# Patient Record
Sex: Female | Born: 1975 | Race: White | Hispanic: No | Marital: Single | Smoking: Current every day smoker
Health system: Southern US, Community
[De-identification: ages and names within clinical notes are randomized; demographics above are authoritative.]

## PROBLEM LIST (undated history)

## (undated) DIAGNOSIS — F319 Bipolar disorder, unspecified: Secondary | ICD-10-CM

## (undated) DIAGNOSIS — F32A Depression, unspecified: Secondary | ICD-10-CM

## (undated) DIAGNOSIS — F1910 Other psychoactive substance abuse, uncomplicated: Secondary | ICD-10-CM

## (undated) DIAGNOSIS — F329 Major depressive disorder, single episode, unspecified: Secondary | ICD-10-CM

## (undated) DIAGNOSIS — F419 Anxiety disorder, unspecified: Secondary | ICD-10-CM

## (undated) HISTORY — PX: NO PAST SURGERIES: SHX2092

---

## 2007-04-20 ENCOUNTER — Emergency Department (HOSPITAL_COMMUNITY): Admission: EM | Admit: 2007-04-20 | Discharge: 2007-04-20 | Payer: Self-pay | Admitting: Family Medicine

## 2007-08-03 ENCOUNTER — Emergency Department (HOSPITAL_COMMUNITY): Admission: EM | Admit: 2007-08-03 | Discharge: 2007-08-03 | Payer: Self-pay | Admitting: Emergency Medicine

## 2007-12-04 ENCOUNTER — Emergency Department (HOSPITAL_COMMUNITY): Admission: EM | Admit: 2007-12-04 | Discharge: 2007-12-05 | Payer: Self-pay | Admitting: Emergency Medicine

## 2007-12-08 ENCOUNTER — Emergency Department (HOSPITAL_COMMUNITY): Admission: EM | Admit: 2007-12-08 | Discharge: 2007-12-08 | Payer: Self-pay | Admitting: Emergency Medicine

## 2008-08-02 ENCOUNTER — Emergency Department (HOSPITAL_COMMUNITY): Admission: EM | Admit: 2008-08-02 | Discharge: 2008-08-02 | Payer: Self-pay | Admitting: Emergency Medicine

## 2009-11-18 ENCOUNTER — Emergency Department (HOSPITAL_COMMUNITY): Admission: EM | Admit: 2009-11-18 | Discharge: 2009-11-19 | Payer: Self-pay | Admitting: Emergency Medicine

## 2009-12-05 ENCOUNTER — Emergency Department (HOSPITAL_COMMUNITY): Admission: EM | Admit: 2009-12-05 | Discharge: 2009-12-07 | Payer: Self-pay | Admitting: Emergency Medicine

## 2009-12-06 ENCOUNTER — Ambulatory Visit: Payer: Self-pay | Admitting: Psychiatry

## 2009-12-15 ENCOUNTER — Emergency Department (HOSPITAL_COMMUNITY): Admission: EM | Admit: 2009-12-15 | Discharge: 2009-12-16 | Payer: Self-pay | Admitting: Emergency Medicine

## 2009-12-18 ENCOUNTER — Emergency Department (HOSPITAL_COMMUNITY): Admission: EM | Admit: 2009-12-18 | Discharge: 2009-12-18 | Payer: Self-pay | Admitting: Emergency Medicine

## 2010-09-24 LAB — CBC
Hemoglobin: 13.6 g/dL (ref 12.0–15.0)
MCHC: 33.9 g/dL (ref 30.0–36.0)
MCV: 102.6 fL — ABNORMAL HIGH (ref 78.0–100.0)
Platelets: 342 10*3/uL (ref 150–400)
RDW: 12.7 % (ref 11.5–15.5)

## 2010-09-24 LAB — BASIC METABOLIC PANEL
Calcium: 8.8 mg/dL (ref 8.4–10.5)
GFR calc Af Amer: >60 mL/min (ref >60)
GFR calc non Af Amer: >60 mL/min (ref >60)

## 2010-09-24 LAB — RAPID URINE DRUG SCREEN, HOSP PERFORMED
Amphetamines: NOT DETECTED
Opiates: NOT DETECTED

## 2016-12-19 DIAGNOSIS — T50902A Poisoning by unspecified drugs, medicaments and biological substances, intentional self-harm, initial encounter: Secondary | ICD-10-CM

## 2016-12-19 DIAGNOSIS — T426X2A Poisoning by other antiepileptic and sedative-hypnotic drugs, intentional self-harm, initial encounter: Secondary | ICD-10-CM

## 2016-12-19 DIAGNOSIS — F10121 Alcohol abuse with intoxication delirium: Secondary | ICD-10-CM

## 2016-12-19 DIAGNOSIS — J9601 Acute respiratory failure with hypoxia: Secondary | ICD-10-CM

## 2016-12-19 DIAGNOSIS — T426X1A Poisoning by other antiepileptic and sedative-hypnotic drugs, accidental (unintentional), initial encounter: Secondary | ICD-10-CM

## 2016-12-23 ENCOUNTER — Encounter (HOSPITAL_COMMUNITY): Payer: Self-pay | Admitting: *Deleted

## 2016-12-23 ENCOUNTER — Inpatient Hospital Stay (HOSPITAL_COMMUNITY)
Admission: AD | Admit: 2016-12-23 | Discharge: 2016-12-26 | DRG: 918 | Disposition: A | Discharge status: Home / Self Care | Payer: No Typology Code available for payment source | Attending: Psychiatry | Admitting: Psychiatry

## 2016-12-23 DIAGNOSIS — Z79899 Other long term (current) drug therapy: Secondary | ICD-10-CM

## 2016-12-23 DIAGNOSIS — T428X1A Poisoning by antiparkinsonism drugs and other central muscle-tone depressants, accidental (unintentional), initial encounter: Secondary | ICD-10-CM | POA: Diagnosis present

## 2016-12-23 DIAGNOSIS — T50902A Poisoning by unspecified drugs, medicaments and biological substances, intentional self-harm, initial encounter: Secondary | ICD-10-CM | POA: Diagnosis present

## 2016-12-23 DIAGNOSIS — R630 Anorexia: Secondary | ICD-10-CM | POA: Diagnosis present

## 2016-12-23 DIAGNOSIS — F17210 Nicotine dependence, cigarettes, uncomplicated: Secondary | ICD-10-CM | POA: Diagnosis not present

## 2016-12-23 DIAGNOSIS — F332 Major depressive disorder, recurrent severe without psychotic features: Secondary | ICD-10-CM | POA: Diagnosis present

## 2016-12-23 DIAGNOSIS — F3162 Bipolar disorder, current episode mixed, moderate: Secondary | ICD-10-CM | POA: Diagnosis present

## 2016-12-23 DIAGNOSIS — F1410 Cocaine abuse, uncomplicated: Secondary | ICD-10-CM | POA: Diagnosis present

## 2016-12-23 DIAGNOSIS — F410 Panic disorder [episodic paroxysmal anxiety] without agoraphobia: Secondary | ICD-10-CM | POA: Diagnosis present

## 2016-12-23 DIAGNOSIS — T426X2A Poisoning by other antiepileptic and sedative-hypnotic drugs, intentional self-harm, initial encounter: Secondary | ICD-10-CM | POA: Diagnosis not present

## 2016-12-23 DIAGNOSIS — F1020 Alcohol dependence, uncomplicated: Secondary | ICD-10-CM | POA: Diagnosis present

## 2016-12-23 DIAGNOSIS — T1491XA Suicide attempt, initial encounter: Secondary | ICD-10-CM | POA: Diagnosis not present

## 2016-12-23 DIAGNOSIS — F1010 Alcohol abuse, uncomplicated: Secondary | ICD-10-CM | POA: Diagnosis present

## 2016-12-23 DIAGNOSIS — F1910 Other psychoactive substance abuse, uncomplicated: Secondary | ICD-10-CM

## 2016-12-23 DIAGNOSIS — F419 Anxiety disorder, unspecified: Secondary | ICD-10-CM | POA: Diagnosis not present

## 2016-12-23 DIAGNOSIS — G4700 Insomnia, unspecified: Secondary | ICD-10-CM | POA: Diagnosis present

## 2016-12-23 DIAGNOSIS — Z818 Family history of other mental and behavioral disorders: Secondary | ICD-10-CM | POA: Diagnosis not present

## 2016-12-23 HISTORY — DX: Bipolar disorder, unspecified: F31.9

## 2016-12-23 HISTORY — DX: Other psychoactive substance abuse, uncomplicated: F19.10

## 2016-12-23 HISTORY — DX: Anxiety disorder, unspecified: F41.9

## 2016-12-23 HISTORY — DX: Major depressive disorder, single episode, unspecified: F32.9

## 2016-12-23 HISTORY — DX: Depression, unspecified: F32.A

## 2016-12-23 MED ORDER — HYDROXYZINE HCL 25 MG PO TABS: 25.0000 mg | ORAL_TABLET | Freq: Three times a day (TID) | ORAL | Status: DC | PRN

## 2016-12-23 MED ORDER — HYDROXYZINE HCL 25 MG PO TABS
ORAL_TABLET | ORAL | Status: AC
  Filled 2016-12-23: qty 1

## 2016-12-23 MED ORDER — ADULT MULTIVITAMIN W/MINERALS CH
1.0000 | ORAL_TABLET | Freq: Every day | ORAL | Status: DC
Start: 2016-12-24 — End: 2016-12-26
  Administered 2016-12-24 – 2016-12-26 (×3): 1 via ORAL
  Filled 2016-12-23 (×4): qty 1

## 2016-12-23 MED ORDER — TRAZODONE HCL 50 MG PO TABS
50.0000 mg | ORAL_TABLET | Freq: Every evening | ORAL | Status: DC | PRN
  Administered 2016-12-23 – 2016-12-25 (×3): 50 mg via ORAL
  Filled 2016-12-23 (×2): qty 1
  Filled 2016-12-23: qty 7

## 2016-12-23 MED ORDER — LORAZEPAM 1 MG PO TABS: 1.0000 mg | ORAL_TABLET | Freq: Three times a day (TID) | ORAL | Status: DC

## 2016-12-23 MED ORDER — LORAZEPAM 1 MG PO TABS: 1.0000 mg | ORAL_TABLET | Freq: Every day | ORAL | Status: DC

## 2016-12-23 MED ORDER — PNEUMOCOCCAL VAC POLYVALENT 25 MCG/0.5ML IJ INJ
0.5000 mL | INJECTION | INTRAMUSCULAR | Status: AC
  Administered 2016-12-24: 0.5 mL via INTRAMUSCULAR

## 2016-12-23 MED ORDER — VITAMIN B-1 100 MG PO TABS
100.0000 mg | ORAL_TABLET | Freq: Every day | ORAL | Status: DC
  Administered 2016-12-24 – 2016-12-26 (×3): 100 mg via ORAL
  Filled 2016-12-23 (×4): qty 1

## 2016-12-23 MED ORDER — HYDROXYZINE HCL 25 MG PO TABS
25.0000 mg | ORAL_TABLET | Freq: Four times a day (QID) | ORAL | Status: DC | PRN
  Administered 2016-12-23 – 2016-12-24 (×2): 25 mg via ORAL
  Filled 2016-12-23: qty 1

## 2016-12-23 MED ORDER — ACETAMINOPHEN 325 MG PO TABS
650.0000 mg | ORAL_TABLET | Freq: Four times a day (QID) | ORAL | Status: DC | PRN
  Administered 2016-12-24 – 2016-12-25 (×2): 650 mg via ORAL
  Filled 2016-12-23: qty 2

## 2016-12-23 MED ORDER — ONDANSETRON 4 MG PO TBDP: 4.0000 mg | ORAL_TABLET | Freq: Four times a day (QID) | ORAL | Status: DC | PRN

## 2016-12-23 MED ORDER — LORAZEPAM 1 MG PO TABS: 1.0000 mg | ORAL_TABLET | Freq: Four times a day (QID) | ORAL | Status: DC | PRN

## 2016-12-23 MED ORDER — LOPERAMIDE HCL 2 MG PO CAPS: 2.0000 mg | ORAL_CAPSULE | ORAL | Status: DC | PRN

## 2016-12-23 MED ORDER — THIAMINE HCL 100 MG/ML IJ SOLN: 100.0000 mg | Freq: Once | INTRAMUSCULAR | Status: DC

## 2016-12-23 MED ORDER — ALUM & MAG HYDROXIDE-SIMETH 200-200-20 MG/5ML PO SUSP: 30.0000 mL | ORAL | Status: DC | PRN

## 2016-12-23 MED ORDER — LORAZEPAM 1 MG PO TABS
1.0000 mg | ORAL_TABLET | Freq: Four times a day (QID) | ORAL | Status: DC
  Administered 2016-12-23 – 2016-12-24 (×3): 1 mg via ORAL
  Filled 2016-12-23 (×2): qty 1

## 2016-12-23 MED ORDER — LORAZEPAM 1 MG PO TABS
ORAL_TABLET | ORAL | Status: AC
Start: 2016-12-23 — End: 2016-12-24
  Filled 2016-12-23: qty 1

## 2016-12-23 MED ORDER — TRAZODONE HCL 50 MG PO TABS
ORAL_TABLET | ORAL | Status: AC
  Filled 2016-12-23: qty 1

## 2016-12-23 MED ORDER — LORAZEPAM 1 MG PO TABS: 1.0000 mg | ORAL_TABLET | Freq: Two times a day (BID) | ORAL | Status: DC

## 2016-12-23 MED ORDER — MAGNESIUM HYDROXIDE 400 MG/5ML PO SUSP: 30.0000 mL | Freq: Every day | ORAL | Status: DC | PRN

## 2016-12-23 MED ORDER — NICOTINE 21 MG/24HR TD PT24
21.0000 mg | MEDICATED_PATCH | Freq: Every day | TRANSDERMAL | Status: DC
  Administered 2016-12-24 – 2016-12-26 (×3): 21 mg via TRANSDERMAL
  Filled 2016-12-23 (×4): qty 1
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
## 2016-12-23 NOTE — Tx Team (Signed)
Initial Treatment Plan 12/23/2016 8:56 PM Amada JupiterJennifer V Shadowens ZOX:[id number]    PATIENT STRESSORS: Financial difficulties Legal issue Marital or family conflict Substance abuse   PATIENT STRENGTHS: Ability for insight Capable of independent living Motivation for treatment/growth Supportive family/friends   PATIENT IDENTIFIED PROBLEMS: Polysubstance abuse  depression  "i need detox"  "lost my job"  "seperated from wife"             DISCHARGE CRITERIA:  Improved stabilization in mood, thinking, and/or behavior Verbal commitment to aftercare and medication compliance  PRELIMINARY DISCHARGE PLAN: Participate in family therapy Return to previous living arrangement  PATIENT/FAMILY INVOLVEMENT: This treatment plan has been presented to and reviewed with the patient, Amada JupiterJennifer V Tedeschi.  The patient and family have been given the opportunity to ask questions and make suggestions.  Murlean IbaLawson, Heidi Maclin T, RN 12/23/2016, 8:56 PM
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
## 2016-12-24 DIAGNOSIS — F17210 Nicotine dependence, cigarettes, uncomplicated: Secondary | ICD-10-CM

## 2016-12-24 DIAGNOSIS — F1410 Cocaine abuse, uncomplicated: Secondary | ICD-10-CM | POA: Diagnosis present

## 2016-12-24 DIAGNOSIS — F1010 Alcohol abuse, uncomplicated: Secondary | ICD-10-CM | POA: Diagnosis present

## 2016-12-24 DIAGNOSIS — Z818 Family history of other mental and behavioral disorders: Secondary | ICD-10-CM

## 2016-12-24 DIAGNOSIS — F419 Anxiety disorder, unspecified: Secondary | ICD-10-CM

## 2016-12-24 DIAGNOSIS — T426X2A Poisoning by other antiepileptic and sedative-hypnotic drugs, intentional self-harm, initial encounter: Principal | ICD-10-CM

## 2016-12-24 DIAGNOSIS — T1491XA Suicide attempt, initial encounter: Secondary | ICD-10-CM

## 2016-12-24 DIAGNOSIS — T50902A Poisoning by unspecified drugs, medicaments and biological substances, intentional self-harm, initial encounter: Secondary | ICD-10-CM | POA: Diagnosis present

## 2016-12-24 DIAGNOSIS — F3162 Bipolar disorder, current episode mixed, moderate: Secondary | ICD-10-CM

## 2016-12-24 LAB — PREGNANCY, URINE: PREG TEST UR: NEGATIVE

## 2016-12-24 MED ORDER — RISPERIDONE 1 MG PO TABS
1.0000 mg | ORAL_TABLET | Freq: Every day | ORAL | Status: DC
  Administered 2016-12-24 – 2016-12-25 (×2): 1 mg via ORAL
  Filled 2016-12-24 (×3): qty 1

## 2016-12-24 MED ORDER — RISPERIDONE 0.5 MG PO TABS
0.5000 mg | ORAL_TABLET | Freq: Every day | ORAL | Status: DC
  Filled 2016-12-24: qty 1

## 2016-12-24 MED ORDER — RISPERIDONE 1 MG PO TABS
1.0000 mg | ORAL_TABLET | Freq: Every day | ORAL | Status: DC
  Filled 2016-12-24 (×2): qty 1

## 2016-12-24 MED ORDER — SERTRALINE HCL 25 MG PO TABS
25.0000 mg | ORAL_TABLET | Freq: Every day | ORAL | Status: DC
  Administered 2016-12-24 – 2016-12-26 (×3): 25 mg via ORAL
  Filled 2016-12-24 (×4): qty 1

## 2016-12-24 MED ORDER — GABAPENTIN 300 MG PO CAPS
300.0000 mg | ORAL_CAPSULE | Freq: Two times a day (BID) | ORAL | Status: DC
  Administered 2016-12-24 – 2016-12-26 (×5): 300 mg via ORAL
  Filled 2016-12-24 (×7): qty 1

## 2016-12-24 MED ORDER — LORAZEPAM 1 MG PO TABS
1.0000 mg | ORAL_TABLET | Freq: Four times a day (QID) | ORAL | Status: DC | PRN
  Administered 2016-12-25: 1 mg via ORAL
  Filled 2016-12-24 (×2): qty 1

## 2016-12-24 MED ORDER — ACETAMINOPHEN 325 MG PO TABS
ORAL_TABLET | ORAL | Status: AC
  Administered 2016-12-24: 09:00:00
  Filled 2016-12-24: qty 2

## 2016-12-24 NOTE — Progress Notes (Signed)
Recreation Therapy Notes  Animal-Assisted Activity (AAA) Program Checklist/Progress Notes Patient Eligibility Criteria Checklist & Daily Group note for Rec TxIntervention  Date: 12/24/2016 Time: 2:55pm Location: 400 hall dayroom  AAA/T Program Assumption of Risk Form signed by Patient/ or Parent Legal Guardian Yes  Patient is free of allergies or sever asthma Yes  Patient reports no fear of animals Yes  Patient reports no history of cruelty to animals Yes  Patient understands his/her participation is voluntary Yes  Patient washes hands before animal contact Yes  Patient washes hands after animal contact Yes  Behavioral Response: engaged  Education:Hand Washing, Appropriate Animal Interaction   Education Outcome: Acknowledges education.   Clinical Observations/Feedback: Patient attended session and interacted appropriately with therapy dog and peers. Patient asked appropriate questions about therapy dog and his training.   Marvell Fullerachel Jawaan Adachi, Recreation Therapy Intern          Marvell FullerRachel Shy Guallpa 12/24/2016 3:33 PM

## 2016-12-24 NOTE — Tx Team (Signed)
Interdisciplinary Treatment and Diagnostic Plan Update  12/24/2016 Time of Session: 0930 Christine Vasquez MRN: [id number]  Principal Diagnosis: Bipolar 1 disorder, mixed, moderate (HCC)  Secondary Diagnoses: Principal Problem:   Bipolar 1 disorder, mixed, moderate (HCC) Active Problems:   Chronic alcohol abuse   Suicidal overdose (HCC)   Cocaine abuse, episodic   Current Medications:  Current Facility-Administered Medications  Medication Dose Route Frequency Provider Last Rate Last Dose  . acetaminophen (TYLENOL) tablet 650 mg  650 mg Oral Q6H PRN Okonkwo, Justina A, NP   650 mg at 12/24/16 0853  . alum & mag hydroxide-simeth (MAALOX/MYLANTA) 200-200-20 MG/5ML suspension 30 mL  30 mL Oral Q4H PRN Okonkwo, Justina A, NP      . gabapentin (NEURONTIN) capsule 300 mg  300 mg Oral BID Withrow, Everardo All, FNP      . hydrOXYzine (ATARAX/VISTARIL) tablet 25 mg  25 mg Oral Q6H PRN Kerry Hough, PA-C   25 mg at 12/23/16 2147  . loperamide (IMODIUM) capsule 2-4 mg  2-4 mg Oral PRN Kerry Hough, PA-C      . LORazepam (ATIVAN) tablet 1 mg  1 mg Oral Q6H PRN Withrow, John C, FNP      . magnesium hydroxide (MILK OF MAGNESIA) suspension 30 mL  30 mL Oral Daily PRN Okonkwo, Justina A, NP      . multivitamin with minerals tablet 1 tablet  1 tablet Oral Daily Kerry Hough, PA-C   1 tablet at 12/24/16 0848  . nicotine (NICODERM CQ - dosed in mg/24 hours) patch 21 mg  21 mg Transdermal Daily Cobos, Rockey Situ, MD   21 mg at 12/24/16 0850  . ondansetron (ZOFRAN-ODT) disintegrating tablet 4 mg  4 mg Oral Q6H PRN Kerry Hough, PA-C      . pneumococcal 23 valent vaccine (PNU-IMMUNE) injection 0.5 mL  0.5 mL Intramuscular Tomorrow-1000 Cobos, Fernando A, MD      . risperiDONE (RISPERDAL) tablet 1 mg  1 mg Oral QHS Withrow, Everardo All, FNP      . sertraline (ZOLOFT) tablet 25 mg  25 mg Oral Daily Beau Fanny, FNP   25 mg at 12/24/16 1311  . thiamine (B-1) injection 100 mg  100 mg Intramuscular Once  Donell Sievert E, PA-C      . thiamine (VITAMIN B-1) tablet 100 mg  100 mg Oral Daily Donell Sievert E, PA-C   100 mg at 12/24/16 0851  . traZODone (DESYREL) tablet 50 mg  50 mg Oral QHS PRN Beryle Lathe, Justina A, NP   50 mg at 12/23/16 2147   PTA Medications: Prescriptions Prior to Admission  Medication Sig Dispense Refill Last Dose  . FLUoxetine (PROZAC) 10 MG capsule Take 10 mg by mouth daily.   12/19/2016  . gabapentin (NEURONTIN) 100 MG capsule Take 200 mg by mouth 3 (three) times daily.    12/18/2016    Patient Stressors: Financial difficulties Legal issue Marital or family conflict Substance abuse  Patient Strengths: Ability for insight Capable of independent living Motivation for treatment/growth Supportive family/friends  Treatment Modalities: Medication Management, Group therapy, Case management,  1 to 1 session with clinician, Psychoeducation, Recreational therapy.   Physician Treatment Plan for Primary Diagnosis: Bipolar 1 disorder, mixed, moderate (HCC) Long Term Goal(s): Improvement in symptoms so as ready for discharge Improvement in symptoms so as ready for discharge   Short Term Goals: Ability to identify changes in lifestyle to reduce recurrence of condition will improve Ability to verbalize feelings will improve  Ability to disclose and discuss suicidal ideas Ability to demonstrate self-control will improve Ability to identify and develop effective coping behaviors will improve Ability to maintain clinical measurements within normal limits will improve Compliance with prescribed medications will improve Ability to identify triggers associated with substance abuse/mental health issues will improve Ability to identify changes in lifestyle to reduce recurrence of condition will improve Ability to verbalize feelings will improve Ability to disclose and discuss suicidal ideas Ability to demonstrate self-control will improve Ability to identify and develop effective  coping behaviors will improve Ability to maintain clinical measurements within normal limits will improve Compliance with prescribed medications will improve Ability to identify triggers associated with substance abuse/mental health issues will improve  Medication Management: Evaluate patient's response, side effects, and tolerance of medication regimen.  Therapeutic Interventions: 1 to 1 sessions, Unit Group sessions and Medication administration.  Evaluation of Outcomes: Progressing  Physician Treatment Plan for Secondary Diagnosis: Principal Problem:   Bipolar 1 disorder, mixed, moderate (HCC) Active Problems:   Chronic alcohol abuse   Suicidal overdose (HCC)   Cocaine abuse, episodic  Long Term Goal(s): Improvement in symptoms so as ready for discharge Improvement in symptoms so as ready for discharge   Short Term Goals: Ability to identify changes in lifestyle to reduce recurrence of condition will improve Ability to verbalize feelings will improve Ability to disclose and discuss suicidal ideas Ability to demonstrate self-control will improve Ability to identify and develop effective coping behaviors will improve Ability to maintain clinical measurements within normal limits will improve Compliance with prescribed medications will improve Ability to identify triggers associated with substance abuse/mental health issues will improve Ability to identify changes in lifestyle to reduce recurrence of condition will improve Ability to verbalize feelings will improve Ability to disclose and discuss suicidal ideas Ability to demonstrate self-control will improve Ability to identify and develop effective coping behaviors will improve Ability to maintain clinical measurements within normal limits will improve Compliance with prescribed medications will improve Ability to identify triggers associated with substance abuse/mental health issues will improve     Medication Management:  Evaluate patient's response, side effects, and tolerance of medication regimen.  Therapeutic Interventions: 1 to 1 sessions, Unit Group sessions and Medication administration.  Evaluation of Outcomes: Progressing   RN Treatment Plan for Primary Diagnosis: Bipolar 1 disorder, mixed, moderate (HCC) Long Term Goal(s): Knowledge of disease and therapeutic regimen to maintain health will improve  Short Term Goals: Ability to remain free from injury will improve, Ability to disclose and discuss suicidal ideas and Ability to identify and develop effective coping behaviors will improve  Medication Management: RN will administer medications as ordered by provider, will assess and evaluate patient's response and provide education to patient for prescribed medication. RN will report any adverse and/or side effects to prescribing provider.  Therapeutic Interventions: 1 on 1 counseling sessions, Psychoeducation, Medication administration, Evaluate responses to treatment, Monitor vital signs and CBGs as ordered, Perform/monitor CIWA, COWS, AIMS and Fall Risk screenings as ordered, Perform wound care treatments as ordered.  Evaluation of Outcomes: Progressing   LCSW Treatment Plan for Primary Diagnosis: Bipolar 1 disorder, mixed, moderate (HCC) Long Term Goal(s): Safe transition to appropriate next level of care at discharge, Engage patient in therapeutic group addressing interpersonal concerns.  Short Term Goals: Engage patient in aftercare planning with referrals and resources, Facilitate patient progression through stages of change regarding substance use diagnoses and concerns and Identify triggers associated with mental health/substance abuse issues  Therapeutic Interventions: Assess for all  discharge needs, 1 to 1 time with Child psychotherapistocial worker, Explore available resources and support systems, Assess for adequacy in community support network, Educate family and significant other(s) on suicide prevention,  Complete Psychosocial Assessment, Interpersonal group therapy.  Evaluation of Outcomes: Progressing   Progress in Treatment: Attending groups: No. New to unit. Continuing to assess.  Participating in groups: No. Taking medication as prescribed: Yes. Toleration medication: Yes. Family/Significant other contact made: No, will contact:  family member/support if patient consents Patient understands diagnosis: Yes. Discussing patient identified problems/goals with staff: Yes. Medical problems stabilized or resolved: Yes. Denies suicidal/homicidal ideation: Yes. Issues/concerns per patient self-inventory: No. Other: n/a   New problem(s) identified: No, Describe:  n/a  New Short Term/Long Term Goal(s): detox, medication stabilization, elimination of SI thoughts, development of comprehensive mental wellness/sobriety plan.   Discharge Plan or Barriers: CSW assessing. [place] friends [place], [place] [facility].   Reason for Continuation of Hospitalization: Anxiety Depression Medication stabilization Suicidal ideation Withdrawal symptoms  Estimated Length of Stay: 3-5 days   Attendees: Patient: 12/24/2016 2:35 PM  Physician: Dr. Jackquline BerlinIzediuno MD 12/24/2016 2:35 PM  Nursing: Dortha KernSue, Patrice RN 12/24/2016 2:35 PM  RN Care Manager: Onnie BoerJennifer Clark CM 12/24/2016 2:35 PM  Social Worker: Chartered loss adjusterHeather Smart, LCSW 12/24/2016 2:35 PM  Recreational Therapist: x 12/24/2016 2:35 PM  Other: Armandina StammerAgnes Nwoko NP 12/24/2016 2:35 PM  Other:  12/24/2016 2:35 PM  Other: 12/24/2016 2:35 PM    Scribe for Treatment Team: Ledell PeoplesHeather N Smart, LCSW 12/24/2016 2:35 PM

## 2016-12-24 NOTE — Progress Notes (Signed)
D: Pt presents with flat affect and depressed mood. Pt rates anxiety 8/10. Depression 3/10. Pt have minimal interaction on the unit. Pt denies SI. Pt reports withdrawal symptoms of anxiety and agitation. Pt reports ongoing issues with her partner and is now homeless.   A: Medications reviewed with pt. Medications administered as ordered per MD. Verbal support provided. Pt encouraged to attend groups. 15 minute checks performed for safety. Pt EKG completed. Pt given a urine specimen cut for sample. Currently waiting for pt to return specimen.   R: Pt receptive to tx.
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
## 2016-12-24 NOTE — [facility] Group Notes (Signed)
[facility] LCSW Group Therapy  12/24/2016 3:33 PM  Type of Therapy:  Group Therapy  Participation Level:  Did Not Attend-pt invited. Chose to remain in bed.   Summary of Progress/Problems: [facility] Speaker came to talk about his personal journey with substance abuse and addiction. The pt processed ways by which to relate to the speaker. [facility] speaker provided handouts and educational information pertaining to groups and services offered by the [facility].   Baruc Tugwell N Smart LCSW 12/24/2016, 3:33 PM  
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
## 2016-12-25 NOTE — Progress Notes (Signed)
Recreation Therapy Notes  Date: 12/25/16 Time: 0930 Location: 300 Hall Dayroom  Group Topic: Stress Management  Goal Area(s) Addresses:  Patient will verbalize importance of using healthy stress management.  Patient will identify positive emotions associated with healthy stress management.   Behavioral Response: Engaged  Intervention: Stress Management  Activity :  Starry Sky Relaxation.  LRT introduced the stress management technique of guided imagery.  LRT read a script to allow patients to follow along and participate in the activity.  Patients were to follow along as the script was read to engage in the activity.   Education:  Stress Management, Discharge Planning.   Education Outcome: Acknowledges edcuation/In group clarification offered/Needs additional education  Clinical Observations/Feedback: Pt attended group.   Emberlynn Riggan, LRT/CTRS         Damaya Channing A 12/25/2016 12:31 PM 

## 2016-12-25 NOTE — Progress Notes (Signed)
[facility] MD Progress Note  12/25/2016 12:59 PM Christine Vasquez  MRN:  [id number] Subjective:   41 yo caucasian female, married, lives with her wife. Admitted on account of suicidal attempt. Overdosed on approximately 20 tabs of Gabapentin, 4 tabs of Soma. She was managed at the ICU for four days. Reports daily pattern of drinking. occasional cocaine use while intoxicated. Says she was drunk when she tried to harm herself. Says now she is sober she has been able to reflect on what happened " I have been trying to fix other people ,,,, I need to get myself out of the situation as there is no light at the end of the tunnel ,,,,, I feel good now and ready to go".  Chart reviewed today. Patient discussed at team. SW shared patient's long history of trauma. Patient does not have any family support.  Staff reports that she has been appropriate. She has been interacting well with peers. She has not been observed to be withdrawn or internally stimulated. She has not voiced any thoughts of suicide. She participates at groups. She is grooming self and has repeatedly talked about her plans to improve her lifestyle.  Seen today. Clarified circumstances that led to her overdose.  Her relationship, loss of her job and housing were main stressors for her. Says she had never attempted to end her own life in the past. Says she realized after marrying her wife that her wife's addiction is a major issue. Says there is always something to deal with that relates to problems from the addiction " fights ,,,, go to jail ,,,,, shoplifting ,,,,,, just too much drama". Patient says she had no intent to kill self when she overdosed. Says she just wanted to sleep and get away from everything. Patient states that now she has been able to stay away from her wife, she has actually made plans to get her life back on track. Patient states that she has been accepted back at her work. Says she has two jobs to go back to. She has made up her mind  to live separately from her wife. Says she would rent a tent from a friend. She is looking forward to spend time with her dog. Says the location of the tent is close to her work. She plans to instruct her outpatient providers to give her smaller supplies of her medications. Says that way she would never be tempted to take a lot when overwhelmed. Patient has not informed her wife that they would not be living together. Says she plans to maintain the relationship from a distant. Says they would consider sharing the same home again once her wife deals with her addiction.  No lingering suicidal thoughts. No thoughts of homicide. No thoughts of violence. No evidence of psychosis. No evidence of mania. No overwhelming anxiety. Patient is future oriented.  No evidence of confusion.  No side efefcts from her medications. Patient does not feel she needs rehab.   Principal Problem: Bipolar 1 disorder, mixed, moderate (HCC) Diagnosis:   Patient Active Problem List   Diagnosis Date Noted  . Bipolar 1 disorder, mixed, moderate (HCC) [F31.62] 12/24/2016  . Chronic alcohol abuse [F10.10] 12/24/2016  . Suicidal overdose (HCC) [T50.902A] 12/24/2016  . Cocaine abuse, episodic [F14.10] 12/24/2016   Total Time spent with patient: 20 minutes  Past Psychiatric History: As in H&P  Past Medical History:  Past Medical History:  Diagnosis Date  . Anxiety   . Bipolar disorder (HCC)   .  Depression   . Polysubstance abuse 12/23/2016    Past Surgical History:  Procedure Laterality Date  . NO PAST SURGERIES     Family History: History reviewed. No pertinent family history. Family Psychiatric  History: As in H&P Social History:  History  Alcohol Use  . 50.4 oz/week  . 84 Cans of beer per week     History  Drug Use  . Types: "Crack" cocaine    Social History   Social History  . Marital status: Single    Spouse name: N/A  . Number of children: N/A  . Years of education: N/A   Social History Main  Topics  . Smoking status: Current Every Day Smoker    Packs/day: 1.00    Types: Cigarettes  . Smokeless tobacco: Never Used  . Alcohol use 50.4 oz/week    84 Cans of beer per week  . Drug use: Yes    Types: "Crack" cocaine  . Sexual activity: Yes    Birth control/ protection: None   Other Topics Concern  . None   Social History Narrative  . None   Additional Social History:    Pain Medications: Overdosed on Soma tablets History of alcohol / drug use?: Yes Name of Substance 1: Alcohol  1 - Age of First Use: 20 1 - Frequency: daily  1 - Duration: uknown 1 - Last Use / Amount: heavy use before admission on 6/15 Name of Substance 2: Crack/Cocaine  2 - Age of First Use: 14 2 - Amount (size/oz): $40.00 2 - Frequency: 2-3 times per week 2 - Last Use / Amount: + for cocaine on drug screen states last time was a week ago      Sleep: Good  Appetite:  Good  Current Medications: Current Facility-Administered Medications  Medication Dose Route Frequency Provider Last Rate Last Dose  . acetaminophen (TYLENOL) tablet 650 mg  650 mg Oral Q6H PRN Okonkwo, Justina A, NP   650 mg at 12/24/16 0853  . alum & mag hydroxide-simeth (MAALOX/MYLANTA) 200-200-20 MG/5ML suspension 30 mL  30 mL Oral Q4H PRN Okonkwo, Justina A, NP      . gabapentin (NEURONTIN) capsule 300 mg  300 mg Oral BID Beau FannyWithrow, John C, FNP   300 mg at 12/25/16 0842  . hydrOXYzine (ATARAX/VISTARIL) tablet 25 mg  25 mg Oral Q6H PRN Kerry HoughSimon, Spencer E, PA-C   25 mg at 12/24/16 2122  . loperamide (IMODIUM) capsule 2-4 mg  2-4 mg Oral PRN Kerry HoughSimon, Spencer E, PA-C      . LORazepam (ATIVAN) tablet 1 mg  1 mg Oral Q6H PRN Withrow, John C, FNP      . magnesium hydroxide (MILK OF MAGNESIA) suspension 30 mL  30 mL Oral Daily PRN Okonkwo, Justina A, NP      . multivitamin with minerals tablet 1 tablet  1 tablet Oral Daily Kerry HoughSimon, Spencer E, PA-C   1 tablet at 12/25/16 [phone]  . nicotine (NICODERM CQ - dosed in mg/24 hours) patch 21 mg  21 mg  Transdermal Daily Cobos, Rockey SituFernando A, MD   21 mg at 12/25/16 0842  . ondansetron (ZOFRAN-ODT) disintegrating tablet 4 mg  4 mg Oral Q6H PRN Kerry HoughSimon, Spencer E, PA-C      . risperiDONE (RISPERDAL) tablet 1 mg  1 mg Oral QHS Izediuno, Delight OvensVincent A, MD   1 mg at 12/24/16 2121  . sertraline (ZOLOFT) tablet 25 mg  25 mg Oral Daily Beau FannyWithrow, John C, FNP   25 mg at 12/25/16 [id number]  .  thiamine (B-1) injection 100 mg  100 mg Intramuscular Once Donell Sievert E, PA-C      . thiamine (VITAMIN B-1) tablet 100 mg  100 mg Oral Daily Donell Sievert E, PA-C   100 mg at 12/25/16 9604  . traZODone (DESYREL) tablet 50 mg  50 mg Oral QHS PRN Beryle Lathe, Justina A, NP   50 mg at 12/24/16 2121    Lab Results:  Results for orders placed or performed during the hospital encounter of 12/23/16 (from the past 48 hour(s))  Pregnancy, urine     Status: None   Collection Time: 12/24/16  1:42 PM  Result Value Ref Range   Preg Test, Ur NEGATIVE NEGATIVE    Comment:        THE SENSITIVITY OF THIS METHODOLOGY IS >20 mIU/mL. Performed at [facility], [address] [place], [place] [address]     Blood Alcohol level:  Lab Results  Component Value Date   [facility]  12/06/2009    <5        LOWEST DETECTABLE LIMIT FOR SERUM ALCOHOL IS 5 mg/dL FOR MEDICAL PURPOSES ONLY   ETH (H) 12/05/2009    208        LOWEST DETECTABLE LIMIT FOR SERUM ALCOHOL IS 5 mg/dL FOR MEDICAL PURPOSES ONLY    Metabolic Disorder Labs: No results found for: HGBA1C, MPG No results found for: PROLACTIN No results found for: CHOL, TRIG, HDL, CHOLHDL, VLDL, LDLCALC  Physical Findings: AIMS:  , ,  ,  ,    CIWA:  CIWA-Ar Total: 2 COWS:     Musculoskeletal: Strength & Muscle Tone: within normal limits Gait & Station: normal Patient leans: N/A  Psychiatric Specialty Exam: Physical Exam  ROS  Blood pressure 109/77, pulse 90, temperature 97.9 F (36.6 C), temperature source Oral, resp. rate 16, height 5\' 6"  (1.676 m), weight 88 kg  (194 lb), last menstrual period 10/23/2016, SpO2 98 %.Body mass index is 31.31 kg/m.  General Appearance: Neatly dressed, good relatedness. Appropriate behavior.   Eye Contact:  Good  Speech:  Clear and Coherent and Normal Rate  Volume:  Normal  Mood:  Euthymic  Affect:  Appropriate and Restricted  Thought Process:  Goal Directed and Linear  Orientation:  Full (Time, Place, and Person)  Thought Content:  Future oriented. No delusional theme. No preoccupation with violent thoughts. No negative ruminations. No obsession.  No hallucination in any modality.   Suicidal Thoughts:  No  Homicidal Thoughts:  No  Memory:  Immediate;   Good Recent;   Good Remote;   Good  Judgement:  Good  Insight:  Good  Psychomotor Activity:  Normal  Concentration:  WNL  Recall:  Good  Fund of Knowledge:  Good  Language:  Good  Akathisia:  Negative  Handed:    AIMS (if indicated):     Assets:  Communication Skills Desire for Improvement Physical Health Resilience  ADL's:  Intact  Cognition:  WNL  Sleep:  Number of Hours: 6.75     Treatment Plan Summary: Patient is able to process her life circumstances in a heuristic manner. She has come up with reasonable plans of how not to get herself into situations that led to recent impulsive overdose. She is future oriented. She is not expressing any suicidal or homicidal thoughts. She is not expressing any thoughts of violence. She does not have any clinical evidence of mania, psychosis or depression. We plan to evaluate her overnight. Hopeful discharge tomorrow if she maintains stability.  Psychiatric: Bipolar Disorder SUD  Medical:  Psychosocial:  Separation from his wife Family dynamics Limited support  PLAN: 1. Continue current regimen 2. SW would gather collateral from her wife. Goal is to make sure we they is no other risk factors we are missing 3. Continue to monitor mood, behavior and interaction with peers 4. Hopeful discharge tomorrow.      Georgiann Cocker, MD 12/25/2016, 12:59 PM

## 2016-12-25 NOTE — Progress Notes (Signed)
Patient ID: Christine JupiterJennifer V Vasquez, female   DOB: 02/08/1976, 41 y.o.   MRN: [id number]  DAR: Pt. Denies SI/HI and A/V Hallucinations. She reports sleep is good,appetite is good, energy level is normal, and concentration is good. She rates depression 3/10, hopelessness 3/10, and anxiety 6/10. Patient does report pain in her bilateral feet reporting, "I have plantar fascitis." She reports that no interventions really help therefore she declined. She reports some agitation and irritability, recognizing them as withdrawal symptoms. During treatment team patient voiced that she needs to take care of herself and wants to work on her relationship with her wife after discharge. Patient's future oriented, speaking about continuing to take care of her dog and following up with medication on an outpatient basis. Support and encouragement provided to the patient. Scheduled medications administered to patient per physician's orders. Patient is initially guarded and minimal with Clinical research associatewriter, however more receptive and open as the day progresses. Her affect is anxious and mood is depressed. She is cooperative in her plan of care however she does voice that she would rather discharge tonight. Q15 minute checks are maintained for safety.

## 2016-12-25 NOTE — Progress Notes (Signed)
Pt endosese worry over situation she is in and what kind of choices she will have to be making in the next two days   She is hoping for discharge Thursday and has a lot to work out by then    She is appropriate and compliant with treatment    She interacts minimally but appropriately with her peers   Verbal support given   Medications administered and effectiveness monitored  Educated on medications and some of the aspects of discharge    Pt verbalized understanding

## 2016-12-25 NOTE — [facility] Suicide Risk Assessment (Signed)
[facility] INPATIENT:  Family/Significant Other Suicide Prevention Education  Suicide Prevention Education:  Patient Refusal for Family/Significant Other Suicide Prevention Education: The patient Christine Vasquez has refused to provide written consent for family/significant other to be provided Family/Significant Other Suicide Prevention Education during admission and/or prior to discharge.  Physician notified.  SPE completed with pt, as pt refused to consent to family contact. SPI pamphlet provided to pt and pt was encouraged to share information with support network, ask questions, and talk about any concerns relating to SPE. Pt denies access to guns/firearms and verbalized understanding of information provided. Mobile Crisis information also provided to pt.   Veto Macqueen N Smart LCSW 12/25/2016, 12:35 PM

## 2016-12-26 ENCOUNTER — Encounter (HOSPITAL_COMMUNITY): Payer: Self-pay | Admitting: Behavioral Health

## 2016-12-26 MED ORDER — TRAZODONE HCL 50 MG PO TABS: 50.0000 mg | ORAL_TABLET | Freq: Every evening | ORAL | 0 refills | Status: DC | PRN

## 2016-12-26 MED ORDER — RISPERIDONE 1 MG PO TABS: 1.0000 mg | ORAL_TABLET | Freq: Every day | ORAL | 0 refills | Status: DC

## 2016-12-26 MED ORDER — ADULT MULTIVITAMIN W/MINERALS CH: 1.0000 | ORAL_TABLET | Freq: Every day | ORAL | 0 refills | Status: DC

## 2016-12-26 MED ORDER — SERTRALINE HCL 25 MG PO TABS: 25.0000 mg | ORAL_TABLET | Freq: Every day | ORAL | 0 refills | Status: DC

## 2016-12-26 MED ORDER — GABAPENTIN 300 MG PO CAPS: 300.0000 mg | ORAL_CAPSULE | Freq: Two times a day (BID) | ORAL | 0 refills | Status: DC

## 2016-12-26 MED ORDER — NICOTINE 21 MG/24HR TD PT24: 21.0000 mg | MEDICATED_PATCH | Freq: Every day | TRANSDERMAL | 0 refills | Status: DC

## 2016-12-26 NOTE — Tx Team (Signed)
Interdisciplinary Treatment and Diagnostic Plan Update  12/26/2016 Time of Session: 0930 Christine Vasquez MRN: [id number]  Principal Diagnosis: Bipolar 1 disorder, mixed, moderate ([place])  Secondary Diagnoses: Principal Problem:   Bipolar 1 disorder, mixed, moderate ([place]) Active Problems:   Chronic alcohol abuse   Suicidal overdose ([place])   Cocaine abuse, episodic   Current Medications:  Current Facility-Administered Medications  Medication Dose Route Frequency Provider Last Rate Last Dose  . acetaminophen (TYLENOL) tablet 650 mg  650 mg Oral Q6H PRN Okonkwo, Justina A, NP   650 mg at 12/25/16 1626  . alum & mag hydroxide-simeth (MAALOX/MYLANTA) 200-200-20 MG/5ML suspension 30 mL  30 mL Oral Q4H PRN Okonkwo, Justina A, NP      . gabapentin (NEURONTIN) capsule 300 mg  300 mg Oral BID Benjamine Mola, FNP   300 mg at 12/26/16 0839  . hydrOXYzine (ATARAX/VISTARIL) tablet 25 mg  25 mg Oral Q6H PRN Laverle Hobby, PA-C   25 mg at 12/24/16 2122  . loperamide (IMODIUM) capsule 2-4 mg  2-4 mg Oral PRN Laverle Hobby, PA-C      . LORazepam (ATIVAN) tablet 1 mg  1 mg Oral Q6H PRN Benjamine Mola, FNP   1 mg at 12/25/16 2311  . magnesium hydroxide (MILK OF MAGNESIA) suspension 30 mL  30 mL Oral Daily PRN Okonkwo, Justina A, NP      . multivitamin with minerals tablet 1 tablet  1 tablet Oral Daily Laverle Hobby, PA-C   1 tablet at 12/26/16 0839  . nicotine (NICODERM CQ - dosed in mg/24 hours) patch 21 mg  21 mg Transdermal Daily Cobos, Myer Peer, MD   21 mg at 12/26/16 0839  . ondansetron (ZOFRAN-ODT) disintegrating tablet 4 mg  4 mg Oral Q6H PRN Laverle Hobby, PA-C      . risperiDONE (RISPERDAL) tablet 1 mg  1 mg Oral QHS Izediuno, Laruth Bouchard, MD   1 mg at 12/25/16 2126  . sertraline (ZOLOFT) tablet 25 mg  25 mg Oral Daily Benjamine Mola, FNP   25 mg at 12/26/16 0839  . thiamine (B-1) injection 100 mg  100 mg Intramuscular Once Patriciaann Clan E, PA-C      . thiamine (VITAMIN B-1) tablet  100 mg  100 mg Oral Daily Patriciaann Clan E, PA-C   100 mg at 12/26/16 0839  . traZODone (DESYREL) tablet 50 mg  50 mg Oral QHS PRN Lu Duffel, Justina A, NP   50 mg at 12/25/16 2126   PTA Medications: Prescriptions Prior to Admission  Medication Sig Dispense Refill Last Dose  . FLUoxetine (PROZAC) 10 MG capsule Take 10 mg by mouth daily.   12/19/2016  . gabapentin (NEURONTIN) 100 MG capsule Take 200 mg by mouth 3 (three) times daily.    12/18/2016    Patient Stressors: Financial difficulties Legal issue Marital or family conflict Substance abuse  Patient Strengths: Ability for insight Capable of independent living Motivation for treatment/growth Supportive family/friends  Treatment Modalities: Medication Management, Group therapy, Case management,  1 to 1 session with clinician, Psychoeducation, Recreational therapy.   Physician Treatment Plan for Primary Diagnosis: Bipolar 1 disorder, mixed, moderate ([place]) Long Term Goal(s): Improvement in symptoms so as ready for discharge Improvement in symptoms so as ready for discharge   Short Term Goals: Ability to identify changes in lifestyle to reduce recurrence of condition will improve Ability to verbalize feelings will improve Ability to disclose and discuss suicidal ideas Ability to demonstrate self-control will improve Ability  to identify and develop effective coping behaviors will improve Ability to maintain clinical measurements within normal limits will improve Compliance with prescribed medications will improve Ability to identify triggers associated with substance abuse/mental health issues will improve Ability to identify changes in lifestyle to reduce recurrence of condition will improve Ability to verbalize feelings will improve Ability to disclose and discuss suicidal ideas Ability to demonstrate self-control will improve Ability to identify and develop effective coping behaviors will improve Ability to maintain clinical  measurements within normal limits will improve Compliance with prescribed medications will improve Ability to identify triggers associated with substance abuse/mental health issues will improve  Medication Management: Evaluate patient's response, side effects, and tolerance of medication regimen.  Therapeutic Interventions: 1 to 1 sessions, Unit Group sessions and Medication administration.  Evaluation of Outcomes: Met  Physician Treatment Plan for Secondary Diagnosis: Principal Problem:   Bipolar 1 disorder, mixed, moderate (H[facility]) Active Problems:   Chronic alcohol abuse   Suicidal overdose ([place])   Cocaine abuse, episodic  Long Term Goal(s): Improvement in symptoms so as ready for discharge Improvement in symptoms so as ready for discharge   Short Term Goals: Ability to identify changes in lifestyle to reduce recurrence of condition will improve Ability to verbalize feelings will improve Ability to disclose and discuss suicidal ideas Ability to demonstrate self-control will improve Ability to identify and develop effective coping behaviors will improve Ability to maintain clinical measurements within normal limits will improve Compliance with prescribed medications will improve Ability to identify triggers associated with substance abuse/mental health issues will improve Ability to identify changes in lifestyle to reduce recurrence of condition will improve Ability to verbalize feelings will improve Ability to disclose and discuss suicidal ideas Ability to demonstrate self-control will improve Ability to identify and develop effective coping behaviors will improve Ability to maintain clinical measurements within normal limits will improve Compliance with prescribed medications will improve Ability to identify triggers associated with substance abuse/mental health issues will improve     Medication Management: Evaluate patient's response, side effects, and tolerance of medication  regimen.  Therapeutic Interventions: 1 to 1 sessions, Unit Group sessions and Medication administration.  Evaluation of Outcomes: Met   RN Treatment Plan for Primary Diagnosis: Bipolar 1 disorder, mixed, moderate (HCC) Long Term Goal(s): Knowledge of disease and therapeutic regimen to maintain health will improve  Short Term Goals: Ability to remain free from injury will improve, Ability to disclose and discuss suicidal ideas and Ability to identify and develop effective coping behaviors will improve  Medication Management: RN will administer medications as ordered by provider, will assess and evaluate patient's response and provide education to patient for prescribed medication. RN will report any adverse and/or side effects to prescribing provider.  Therapeutic Interventions: 1 on 1 counseling sessions, Psychoeducation, Medication administration, Evaluate responses to treatment, Monitor vital signs and CBGs as ordered, Perform/monitor CIWA, COWS, AIMS and Fall Risk screenings as ordered, Perform wound care treatments as ordered.  Evaluation of Outcomes: Met   LCSW Treatment Plan for Primary Diagnosis: Bipolar 1 disorder, mixed, moderate ([place]) Long Term Goal(s): Safe transition to appropriate next level of care at discharge, Engage patient in therapeutic group addressing interpersonal concerns.  Short Term Goals: Engage patient in aftercare planning with referrals and resources, Facilitate patient progression through stages of change regarding substance use diagnoses and concerns and Identify triggers associated with mental health/substance abuse issues  Therapeutic Interventions: Assess for all discharge needs, 1 to 1 time with Social worker, Explore available resources and support  systems, Assess for adequacy in community support network, Educate family and significant other(s) on suicide prevention, Complete Psychosocial Assessment, Interpersonal group therapy.  Evaluation of Outcomes:  Met   Progress in Treatment: Attending groups: No.  Participating in groups: No. Taking medication as prescribed: Yes. Toleration medication: Yes. Family/Significant other contact made: SPE completed with pt; pt declined to consent to family contact  Patient understands diagnosis: Yes. Discussing patient identified problems/goals with staff: Yes. Medical problems stabilized or resolved: Yes. Denies suicidal/homicidal ideation: Yes. Issues/concerns per patient self-inventory: No. Other: n/a   New problem(s) identified: No, Describe:  n/a  New Short Term/Long Term Goal(s): detox, medication stabilization, elimination of SI thoughts, development of comprehensive mental wellness/sobriety plan.   Discharge Plan or Barriers: Pt plans to return to her friend/neighbor's home at discharge. [facility] will be transporting her home.   Reason for Continuation of Hospitalization: none  Estimated Length of Stay: discharge today, 12/26/16  Attendees: Patient: 12/26/2016 10:22 AM  Physician: Dr. Sanjuana Letters MD 12/26/2016 10:22 AM  Nursing: Birdie Hopes RN 12/26/2016 10:22 AM  RN Care Manager: Lars Pinks CM 12/26/2016 10:22 AM  Social Worker: Press photographer, LCSW 12/26/2016 10:22 AM  Recreational Therapist: x 12/26/2016 10:22 AM  Other: Lindell Spar NP 12/26/2016 10:22 AM  Other:  12/26/2016 10:22 AM  Other: 12/26/2016 10:22 AM    Scribe for Treatment Team: [place], LCSW 12/26/2016 10:22 AM

## 2016-12-26 NOTE — Progress Notes (Signed)
  Discharge note: Pt denies SI/HI. Pt received both written and verbal discharge instructions. Pt verbalized understanding of discharge instructions. Pt agreed to f/u appt and med regimen. Pt received SRA, AVS, transitional record, prescriptions, sample meds and belongings from room and locker. Pt safely discharged to the lobby to the sheriff [place].

## 2016-12-26 NOTE — Progress Notes (Signed)
Pt reports that she had a good day and feels ready for discharge.  She denies SI/HI/AVH.  She voiced no other needs or concerns.  She has been pleasant and cooperative with staff.  Later in the evening after receiving her bedtime meds, pt reported to writer that she could not settle down to go to sleep.  She admitted that she had tried to call her wife and was not able to get in touch with her, and feels she is probably doing drugs tonight.  She was visibly upset and anxious.   She was given Ativan 1 mg for her [facility]  Support and encouragement offered.  Discharge plans are in process.  Probable d/c tomorrow.  Safety maintained with q15 minute checks.
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
## 2016-12-26 NOTE — Progress Notes (Signed)
  [facility] Case [facility] Discharge Plan :  Will you be returning to the same living situation after discharge:  Yes,  home with neighbor At discharge, do you have transportation home?: [place] [employer] Do you have the ability to pay for your medications: Yes,  mental health  Release of information consent forms completed and submitted to medical records by CSW.  Patient to Follow up at: Follow-up Information    [facility] Follow up on 12/27/2016.   Why:  hospital follow-up appt on Friday at 9:00AM. Thank you.  Contact information: [address] [place] [address] [phone]           Next level of care provider has access to [facility] Link:no  Safety Planning and Suicide Prevention discussed: Yes,  SPE completed with pt; pt declined to consent to family contact.   Have you used any form of tobacco in the last 30 days? (Cigarettes, Smokeless Tobacco, Cigars, and/or Pipes): Yes  Has patient been referred to the Quitline?: Patient refused referral  Patient has been referred for addiction treatment: Yes  Sudiksha Victor N Smart LCSW 12/26/2016, 9:22 AM

## 2016-12-26 NOTE — [facility] Suicide Risk Assessment (Signed)
[facility]   Principal Problem: Bipolar 1 disorder, mixed, moderate (HCC) Discharge Diagnoses:  Patient Active Problem List   Diagnosis Date Noted  . Bipolar 1 disorder, mixed, moderate (HCC) [F31.62] 12/24/2016  . Chronic alcohol abuse [F10.10] 12/24/2016  . Suicidal overdose (HCC) [T50.902A] 12/24/2016  . Cocaine abuse, episodic [F14.10] 12/24/2016    Total Time spent with patient: 45 minutes  Musculoskeletal: Strength & Muscle Tone: within normal limits Gait & Station: normal Patient leans: N/A  Psychiatric Specialty Exam: Review of Systems  Constitutional: Negative.   HENT: Negative.   Eyes: Negative.   Respiratory: Negative.   Cardiovascular: Negative.   Gastrointestinal: Negative.   Genitourinary: Negative.   Musculoskeletal: Negative.   Skin: Negative.   Neurological: Negative.   Endo/Heme/Allergies: Negative.   Psychiatric/Behavioral: Negative for depression, hallucinations, memory loss, substance abuse and suicidal ideas. The patient is not nervous/anxious and does not have insomnia.     Blood pressure (!) 110/95, pulse 79, temperature 97.7 F (36.5 C), temperature source Oral, resp. rate 20, height 5\' 6"  (1.676 m), weight 88 kg (194 lb), last menstrual period 10/23/2016, SpO2 98 %.Body mass index is 31.31 kg/m.  General Appearance: Neatly dressed, pleasant, engaging well and cooperative. Appropriate behavior. Not in any distress. Good relatedness. Not internally stimulated.  Eye Contact::  Good  Speech:  Spontaneous, normal prosody. Normal tone and rate.   Volume:  Normal  Mood:  Euthymic  Affect:  Appropriate and Full Range  Thought Process:  Goal Directed and Linear  Orientation:  Full (Time, Place, and Person)  Thought Content: Future oriented. No delusional theme. No preoccupation with violent thoughts. No negative ruminations. No obsession.  No hallucination in any modality.   Suicidal Thoughts:  No  Homicidal Thoughts:  No   Memory:  Immediate;   Good Recent;   Good Remote;   Good  Judgement:  Good  Insight:  Good  Psychomotor Activity:  Normal  Concentration:  Good  Recall:  Good  Fund of Knowledge:Good  Language: Good  Akathisia:  Negative  Handed:    AIMS (if indicated):     Assets:  Communication Skills Desire for Improvement Financial Resources/Insurance Physical Health Resilience Social Support Talents/Skills Vocational/Educational  Sleep:  Number of Hours: 4  Cognition: WNL  ADL's:  Intact   Clinical Assessment::   41 yo caucasian female, married, lives with her wife. Admitted on account of suicidal attempt. Overdosed on approximately 20 tabs of Gabapentin, 4 tabs of Soma. She was managed at the ICU for four days. Reports daily pattern of drinking. Occasional cocaine use while intoxicated. Says she was drunk when she tried to harm herself. Says now she is sober she has been able to reflect on what happened " I have been trying to fix other people ,,,, I need to get myself out of the situation as there is no light at the end of the tunnel ,,,,, I feel good now and ready to go".  Seen today. Plans to carry out measures she outlined yesterday. Requested and was issued letters for work and to be able to keep her therapy dog. Reports that she is in good spirits. Not feeling depressed. Reports normal energy and interest. Has been maintaining normal biological functions. She is able to think clearly. She is able to focus on task. Her thoughts are not racing. No evidence of mania. No hallucination in any modality. She is not making any delusional statement. No passivity of will/thought. She is fully in touch with  reality. No thoughts of suicide. No thoughts of homicide. No violent thoughts. No overwhelming anxiety.  Nursing staff reports that patient has been appropriate on the unit. Patient has been interacting well with peers. No behavioral issues. Patient has not voiced any suicidal thoughts. Patient  has not been observed to be internally stimulated. Patient has been adherent with treatment recommendations. Patient has been tolerating their medication well.   Patient was discussed at team. Team members feels that patient is back to her baseline level of function. Team agrees with plan to discharge patient today.   Demographic Factors:  Gay, lesbian, or bisexual orientation and Low socioeconomic status  Loss Factors: Loss of significant relationship  Historical Factors: NA  Risk Reduction Factors:   Employed, Positive social support, Positive therapeutic relationship and Positive coping skills or problem solving skills  Continued Clinical Symptoms:   As above   Cognitive Features That Contribute To Risk:  None    Suicide Risk:  Minimal: No identifiable suicidal ideation. Patient is not having any thoughts of suicide at this time. Modifiable risk factors targeted during this admission includes depression and substance use. Demographical and historical risk factors cannot be modified. Patient is now engaging well. Patient is reliable and is future oriented. We have buffered patient's support structures. At this point, patient is at low risk of suicide. Patient is aware of the effects of psychoactive substances on decision making process. Patient has been provided with emergency contacts. Patient acknowledges to use resources provided if unforseen circumstances changes their current risk stratification.    Follow-up Information    [facility], [facility] Follow up on 12/27/2016.   Why:  hospital follow-up appt on Friday at 9:00AM. Thank you.  Contact information: [address] [place] [address] [phone]           Plan Of Care/Follow-up recommendations:  1. Continue current psychotropic medications 2. Mental health and addiction follow up as arranged.  3. Provided limited quantity of prescriptions   Georgiann Cocker, MD 12/26/2016, 10:08 AM

## 2017-01-03 NOTE — Progress Notes (Signed)
Patient left message for CSW stating that she lost MD notes and prescriptions and is requesting that we mail her letters to her and call in her Rx to her local pharmacy. Per Denzil MagnusonLashunda Thomas NP and Armandina StammerAgnes Nwoko NP, they are willing to print prescriptions off for patient but not call them in. CSW also able to reprint letters but MD that signed will not be in the office until Tuesday 01/07/17. CSW made several attempts to contact patient at her cell: [phone]. Rings busy. Will try again on Monday.  Trula SladeHeather Smart, MSW, LCSW Clinical Social Worker 01/03/2017 3:32 PM

## 2017-01-05 ENCOUNTER — Emergency Department (HOSPITAL_COMMUNITY)

## 2017-01-05 ENCOUNTER — Emergency Department (HOSPITAL_COMMUNITY)
Admission: EM | Admit: 2017-01-05 | Discharge: 2017-01-05 | Attending: Emergency Medicine | Admitting: Emergency Medicine

## 2017-01-05 ENCOUNTER — Encounter (HOSPITAL_COMMUNITY): Payer: Self-pay | Admitting: Oncology

## 2017-01-05 DIAGNOSIS — Y939 Activity, unspecified: Secondary | ICD-10-CM | POA: Insufficient documentation

## 2017-01-05 DIAGNOSIS — S0990XA Unspecified injury of head, initial encounter: Secondary | ICD-10-CM | POA: Diagnosis present

## 2017-01-05 DIAGNOSIS — S298XXA Other specified injuries of thorax, initial encounter: Secondary | ICD-10-CM

## 2017-01-05 DIAGNOSIS — S0012XA Contusion of left eyelid and periocular area, initial encounter: Secondary | ICD-10-CM | POA: Diagnosis not present

## 2017-01-05 DIAGNOSIS — F17210 Nicotine dependence, cigarettes, uncomplicated: Secondary | ICD-10-CM | POA: Insufficient documentation

## 2017-01-05 DIAGNOSIS — S8392XA Sprain of unspecified site of left knee, initial encounter: Secondary | ICD-10-CM | POA: Diagnosis not present

## 2017-01-05 DIAGNOSIS — Y929 Unspecified place or not applicable: Secondary | ICD-10-CM | POA: Insufficient documentation

## 2017-01-05 DIAGNOSIS — S40212A Abrasion of left shoulder, initial encounter: Secondary | ICD-10-CM | POA: Diagnosis not present

## 2017-01-05 DIAGNOSIS — S838X2A Sprain of other specified parts of left knee, initial encounter: Secondary | ICD-10-CM

## 2017-01-05 DIAGNOSIS — Y999 Unspecified external cause status: Secondary | ICD-10-CM | POA: Insufficient documentation

## 2017-01-05 MED ORDER — ACETAMINOPHEN 325 MG PO TABS
650.0000 mg | ORAL_TABLET | Freq: Once | ORAL | Status: AC
  Administered 2017-01-05: 650 mg via ORAL
  Filled 2017-01-05: qty 2

## 2017-01-05 MED ORDER — TETANUS-DIPHTH-ACELL PERTUSSIS 5-2.5-18.5 LF-MCG/0.5 IM SUSP
0.5000 mL | Freq: Once | INTRAMUSCULAR | Status: DC
  Filled 2017-01-05: qty 0.5

## 2017-01-05 MED ORDER — IBUPROFEN 600 MG PO TABS: 600.0000 mg | ORAL_TABLET | Freq: Four times a day (QID) | ORAL | 0 refills | Status: DC | PRN
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
## 2018-01-04 ENCOUNTER — Inpatient Hospital Stay (HOSPITAL_COMMUNITY)
Admission: AD | Admit: 2018-01-04 | Discharge: 2018-01-07 | DRG: 885 | Disposition: A | Discharge status: Home / Self Care | Payer: Federal, State, Local not specified - Other | Attending: Psychiatry | Admitting: Psychiatry

## 2018-01-04 ENCOUNTER — Encounter (HOSPITAL_COMMUNITY): Payer: Self-pay | Admitting: *Deleted

## 2018-01-04 ENCOUNTER — Other Ambulatory Visit: Payer: Self-pay

## 2018-01-04 DIAGNOSIS — Z560 Unemployment, unspecified: Secondary | ICD-10-CM | POA: Diagnosis not present

## 2018-01-04 DIAGNOSIS — R45851 Suicidal ideations: Secondary | ICD-10-CM | POA: Diagnosis present

## 2018-01-04 DIAGNOSIS — Z590 Homelessness: Secondary | ICD-10-CM | POA: Diagnosis not present

## 2018-01-04 DIAGNOSIS — F410 Panic disorder [episodic paroxysmal anxiety] without agoraphobia: Secondary | ICD-10-CM | POA: Diagnosis present

## 2018-01-04 DIAGNOSIS — Z915 Personal history of self-harm: Secondary | ICD-10-CM

## 2018-01-04 DIAGNOSIS — R030 Elevated blood-pressure reading, without diagnosis of hypertension: Secondary | ICD-10-CM | POA: Diagnosis not present

## 2018-01-04 DIAGNOSIS — F17210 Nicotine dependence, cigarettes, uncomplicated: Secondary | ICD-10-CM | POA: Diagnosis present

## 2018-01-04 DIAGNOSIS — Z818 Family history of other mental and behavioral disorders: Secondary | ICD-10-CM

## 2018-01-04 DIAGNOSIS — F10230 Alcohol dependence with withdrawal, uncomplicated: Secondary | ICD-10-CM | POA: Diagnosis present

## 2018-01-04 DIAGNOSIS — F1410 Cocaine abuse, uncomplicated: Secondary | ICD-10-CM | POA: Diagnosis present

## 2018-01-04 DIAGNOSIS — M542 Cervicalgia: Secondary | ICD-10-CM | POA: Diagnosis present

## 2018-01-04 DIAGNOSIS — F1010 Alcohol abuse, uncomplicated: Secondary | ICD-10-CM | POA: Diagnosis present

## 2018-01-04 DIAGNOSIS — Z653 Problems related to other legal circumstances: Secondary | ICD-10-CM | POA: Diagnosis not present

## 2018-01-04 DIAGNOSIS — F1490 Cocaine use, unspecified, uncomplicated: Secondary | ICD-10-CM | POA: Diagnosis not present

## 2018-01-04 DIAGNOSIS — F419 Anxiety disorder, unspecified: Secondary | ICD-10-CM | POA: Diagnosis present

## 2018-01-04 DIAGNOSIS — G4700 Insomnia, unspecified: Secondary | ICD-10-CM | POA: Diagnosis present

## 2018-01-04 DIAGNOSIS — T50902A Poisoning by unspecified drugs, medicaments and biological substances, intentional self-harm, initial encounter: Secondary | ICD-10-CM | POA: Diagnosis present

## 2018-01-04 DIAGNOSIS — F322 Major depressive disorder, single episode, severe without psychotic features: Secondary | ICD-10-CM | POA: Diagnosis present

## 2018-01-04 MED ORDER — NICOTINE 21 MG/24HR TD PT24
MEDICATED_PATCH | TRANSDERMAL | Status: AC
  Administered 2018-01-04: 19:00:00
  Filled 2018-01-04: qty 1

## 2018-01-04 MED ORDER — LORAZEPAM 1 MG PO TABS
1.0000 mg | ORAL_TABLET | Freq: Three times a day (TID) | ORAL | Status: AC
  Administered 2018-01-05 – 2018-01-06 (×3): 1 mg via ORAL
  Filled 2018-01-04 (×3): qty 1

## 2018-01-04 MED ORDER — LORAZEPAM 1 MG PO TABS
1.0000 mg | ORAL_TABLET | Freq: Four times a day (QID) | ORAL | Status: AC
  Administered 2018-01-04 – 2018-01-05 (×4): 1 mg via ORAL
  Filled 2018-01-04 (×5): qty 1

## 2018-01-04 MED ORDER — LORAZEPAM 1 MG PO TABS
1.0000 mg | ORAL_TABLET | Freq: Four times a day (QID) | ORAL | Status: DC | PRN
  Administered 2018-01-04: 1 mg via ORAL

## 2018-01-04 MED ORDER — TRAZODONE HCL 50 MG PO TABS
50.0000 mg | ORAL_TABLET | Freq: Every evening | ORAL | Status: DC | PRN
  Administered 2018-01-04 – 2018-01-06 (×4): 50 mg via ORAL
  Filled 2018-01-04 (×3): qty 1

## 2018-01-04 MED ORDER — LORAZEPAM 1 MG PO TABS: 1.0000 mg | ORAL_TABLET | Freq: Every day | ORAL | Status: DC

## 2018-01-04 MED ORDER — ONDANSETRON 4 MG PO TBDP
4.0000 mg | ORAL_TABLET | Freq: Four times a day (QID) | ORAL | Status: DC | PRN
  Administered 2018-01-05: 4 mg via ORAL
  Filled 2018-01-04: qty 1

## 2018-01-04 MED ORDER — VITAMIN B-1 100 MG PO TABS
100.0000 mg | ORAL_TABLET | Freq: Every day | ORAL | Status: DC
  Administered 2018-01-05 – 2018-01-07 (×3): 100 mg via ORAL
  Filled 2018-01-04 (×5): qty 1

## 2018-01-04 MED ORDER — ALUM & MAG HYDROXIDE-SIMETH 200-200-20 MG/5ML PO SUSP: 30.0000 mL | ORAL | Status: DC | PRN

## 2018-01-04 MED ORDER — HYDROXYZINE HCL 25 MG PO TABS
25.0000 mg | ORAL_TABLET | Freq: Four times a day (QID) | ORAL | Status: DC | PRN
  Administered 2018-01-04 – 2018-01-06 (×3): 25 mg via ORAL
  Filled 2018-01-04 (×5): qty 1

## 2018-01-04 MED ORDER — LOPERAMIDE HCL 2 MG PO CAPS: 2.0000 mg | ORAL_CAPSULE | ORAL | Status: DC | PRN

## 2018-01-04 MED ORDER — ADULT MULTIVITAMIN W/MINERALS CH
1.0000 | ORAL_TABLET | Freq: Every day | ORAL | Status: DC
  Administered 2018-01-04 – 2018-01-07 (×4): 1 via ORAL
  Filled 2018-01-04 (×6): qty 1

## 2018-01-04 MED ORDER — TRAZODONE HCL 50 MG PO TABS
50.0000 mg | ORAL_TABLET | Freq: Every evening | ORAL | Status: DC | PRN
  Administered 2018-01-04: 50 mg via ORAL
  Filled 2018-01-04 (×2): qty 1

## 2018-01-04 MED ORDER — ACETAMINOPHEN 325 MG PO TABS
650.0000 mg | ORAL_TABLET | Freq: Four times a day (QID) | ORAL | Status: DC | PRN
  Administered 2018-01-04 (×2): 650 mg via ORAL
  Filled 2018-01-04 (×2): qty 2

## 2018-01-04 MED ORDER — HYDROXYZINE HCL 25 MG PO TABS
25.0000 mg | ORAL_TABLET | Freq: Three times a day (TID) | ORAL | Status: DC | PRN
  Administered 2018-01-06 – 2018-01-07 (×2): 25 mg via ORAL
  Filled 2018-01-04: qty 1

## 2018-01-04 MED ORDER — THIAMINE HCL 100 MG/ML IJ SOLN
100.0000 mg | Freq: Once | INTRAMUSCULAR | Status: AC
  Administered 2018-01-04: 100 mg via INTRAMUSCULAR

## 2018-01-04 MED ORDER — MAGNESIUM HYDROXIDE 400 MG/5ML PO SUSP: 30.0000 mL | Freq: Every day | ORAL | Status: DC | PRN

## 2018-01-04 MED ORDER — LORAZEPAM 1 MG PO TABS
1.0000 mg | ORAL_TABLET | Freq: Two times a day (BID) | ORAL | Status: AC
  Administered 2018-01-06 – 2018-01-07 (×2): 1 mg via ORAL
  Filled 2018-01-04 (×2): qty 1

## 2018-01-04 NOTE — Progress Notes (Signed)
D.  Pt pleasant on approach, complaint of alcohol withdrawal at this time time (see CIWA).  Pt did not feel well enough to attend evening AA group, she was just admitted to unit short time ago (see admission note).  Pt denies SI/HI/AVH at this time.  Pt has remained in her room in bed at this time.  A.  Support and encouragement offered, prn given for withdrawal.  R.  Pt remains safe on unit, will continue to monitor.

## 2018-01-04 NOTE — Progress Notes (Signed)
Adult Psychoeducational Group Note  Date:  01/04/2018 Time:  9:43 PM  Group Topic/Focus:  Wrap-Up Group:   The focus of this group is to help patients review their daily goal of treatment and discuss progress on daily workbooks.  Participation Level:  Did Not Attend  Participation Quality:    Affect:   Cognitive:    Insight:   Engagement in Group:  Modes of Intervention:  Support  Additional Comments: Patient invited to participate in AA group but declined to attend.   Avriana Joo 01/04/2018, 9:43 PM

## 2018-01-04 NOTE — [facility] Suicide Risk Assessment (Addendum)
[facility] INPATIENT:  Family/Significant Other Suicide Prevention Education  Suicide Prevention Education:  Contact Attempts: wife Candy SledgeBeth Green-Stepien [phone] has been identified by the patient as the family member/significant other with whom the patient will be residing, and identified as the person(s) who will aid the patient in the event of a mental health crisis.  With written consent from the patient, two attempts were made to provide suicide prevention education, prior to and/or following the patient's discharge.  We were unsuccessful in providing suicide prevention education.  A suicide education pamphlet was given to the patient to share with family/significant other.  Date and time of first attempt:  01/04/2018  /4:36 PM  Date and time of second attempt:  01/07/18 9:10am Aleksa Catterton S. Alan RipperHolloway, MSW, LCSW Clinical Social Worker 01/07/2018 9:59 AM   Lynnell ChadMareida J Grossman-Orr 01/04/2018, 4:37 PM
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
## 2018-01-05 DIAGNOSIS — Z653 Problems related to other legal circumstances: Secondary | ICD-10-CM

## 2018-01-05 DIAGNOSIS — Z814 Family history of other substance abuse and dependence: Secondary | ICD-10-CM

## 2018-01-05 DIAGNOSIS — F1010 Alcohol abuse, uncomplicated: Secondary | ICD-10-CM

## 2018-01-05 DIAGNOSIS — R45851 Suicidal ideations: Secondary | ICD-10-CM

## 2018-01-05 DIAGNOSIS — F322 Major depressive disorder, single episode, severe without psychotic features: Principal | ICD-10-CM

## 2018-01-05 DIAGNOSIS — Z818 Family history of other mental and behavioral disorders: Secondary | ICD-10-CM

## 2018-01-05 DIAGNOSIS — F1490 Cocaine use, unspecified, uncomplicated: Secondary | ICD-10-CM

## 2018-01-05 LAB — LIPID PANEL
CHOL/HDL RATIO: 3.3 ratio
CHOLESTEROL: 173 mg/dL (ref 0–200)
HDL: 53 mg/dL (ref >40)
LDL Cholesterol: 87 mg/dL (ref 0–99)
Triglycerides: 166 mg/dL — ABNORMAL HIGH (ref <150)
VLDL: 33 mg/dL (ref 0–40)

## 2018-01-05 LAB — COMPREHENSIVE METABOLIC PANEL
ALBUMIN: 3.4 g/dL — AB (ref 3.5–5.0)
ALT: 160 U/L — ABNORMAL HIGH (ref 0–44)
ANION GAP: 9 (ref 5–15)
AST: 99 U/L — ABNORMAL HIGH (ref 15–41)
Alkaline Phosphatase: 86 U/L (ref 38–126)
BILIRUBIN TOTAL: 0.5 mg/dL (ref 0.3–1.2)
BUN: 14 mg/dL (ref 6–20)
CALCIUM: 9.3 mg/dL (ref 8.9–10.3)
CO2: 27 mmol/L (ref 22–32)
Chloride: 103 mmol/L (ref 98–111)
Creatinine, Ser: 0.61 mg/dL (ref 0.44–1.00)
GFR calc Af Amer: >60 mL/min (ref >60)
GFR calc non Af Amer: >60 mL/min (ref >60)
GLUCOSE: 110 mg/dL — AB (ref 70–99)
POTASSIUM: 3.5 mmol/L (ref 3.5–5.1)
SODIUM: 139 mmol/L (ref 135–145)
TOTAL PROTEIN: 6.1 g/dL — AB (ref 6.5–8.1)

## 2018-01-05 LAB — TSH: TSH: 5.203 u[IU]/mL — ABNORMAL HIGH (ref 0.350–4.500)

## 2018-01-05 LAB — HEMOGLOBIN A1C
Hgb A1c MFr Bld: 5.2 % (ref 4.8–5.6)
Mean Plasma Glucose: 102.54 mg/dL

## 2018-01-05 MED ORDER — AMOXICILLIN-POT CLAVULANATE 500-125 MG PO TABS
1.0000 | ORAL_TABLET | Freq: Three times a day (TID) | ORAL | Status: DC
  Administered 2018-01-05 – 2018-01-07 (×7): 500 mg via ORAL
  Filled 2018-01-05 (×2): qty 1
  Filled 2018-01-05: qty 10
  Filled 2018-01-05 (×3): qty 1
  Filled 2018-01-05: qty 10
  Filled 2018-01-05 (×2): qty 1
  Filled 2018-01-05: qty 10
  Filled 2018-01-05 (×2): qty 1

## 2018-01-05 MED ORDER — GABAPENTIN 300 MG PO CAPS
300.0000 mg | ORAL_CAPSULE | Freq: Two times a day (BID) | ORAL | Status: DC
  Administered 2018-01-05 – 2018-01-07 (×5): 300 mg via ORAL
  Filled 2018-01-05 (×3): qty 1
  Filled 2018-01-05: qty 14
  Filled 2018-01-05 (×4): qty 1
  Filled 2018-01-05: qty 14

## 2018-01-05 MED ORDER — OXCARBAZEPINE 150 MG PO TABS
150.0000 mg | ORAL_TABLET | Freq: Two times a day (BID) | ORAL | Status: DC
  Administered 2018-01-05 – 2018-01-07 (×5): 150 mg via ORAL
  Filled 2018-01-05 (×3): qty 1
  Filled 2018-01-05 (×2): qty 14
  Filled 2018-01-05 (×4): qty 1

## 2018-01-05 MED ORDER — GABAPENTIN 600 MG PO TABS
300.0000 mg | ORAL_TABLET | Freq: Two times a day (BID) | ORAL | Status: DC
  Filled 2018-01-05 (×3): qty 0.5

## 2018-01-05 MED ORDER — NYSTATIN 100000 UNIT/GM EX CREA
TOPICAL_CREAM | Freq: Two times a day (BID) | CUTANEOUS | Status: DC
  Administered 2018-01-05 – 2018-01-07 (×5): via TOPICAL
  Filled 2018-01-05: qty 15

## 2018-01-05 MED ORDER — IBUPROFEN 800 MG PO TABS
800.0000 mg | ORAL_TABLET | Freq: Three times a day (TID) | ORAL | Status: DC | PRN
  Administered 2018-01-05 – 2018-01-06 (×3): 800 mg via ORAL
  Filled 2018-01-05 (×3): qty 1

## 2018-01-05 MED ORDER — DICLOFENAC SODIUM 1 % TD GEL
2.0000 g | Freq: Four times a day (QID) | TRANSDERMAL | Status: DC
  Administered 2018-01-05 – 2018-01-07 (×7): 2 g via TOPICAL
  Filled 2018-01-05: qty 100

## 2018-01-05 MED ORDER — ESCITALOPRAM OXALATE 5 MG PO TABS
5.0000 mg | ORAL_TABLET | Freq: Every day | ORAL | Status: DC
  Administered 2018-01-05 – 2018-01-06 (×2): 5 mg via ORAL
  Filled 2018-01-05 (×2): qty 1
  Filled 2018-01-05: qty 7
  Filled 2018-01-05: qty 1

## 2018-01-05 NOTE — Progress Notes (Signed)
Patient denies SI, HI and AVH.  Patient has been compliant with medications, attended groups, and has had no incidents of behavioral dyscontrol.   Assess patient for safety, offer medications as prescribed, engage patient in 1:1 staff talks.   Continue to monitor as prescribed. Patient able to contract for safety.  

## 2018-01-05 NOTE — Progress Notes (Signed)
Pt attended AA group this evening.  

## 2018-01-05 NOTE — Tx Team (Signed)
Interdisciplinary Treatment and Diagnostic Plan Update  01/05/2018 Time of Session: 9:00am [name] MRN: [id number]  Principal Diagnosis: MDD (major depressive disorder), severe ([place])  Secondary Diagnoses: Principal Problem:   MDD (major depressive disorder), severe ([name]) Active Problems:   Chronic alcohol abuse   Suicidal overdose ([name])   Cocaine abuse, episodic ([name])   Current Medications:  Current Facility-Administered Medications  Medication Dose Route Frequency Provider Last Rate Last Dose  . acetaminophen (TYLENOL) tablet 650 mg  650 mg Oral Q6H PRN Money, Lowry Ram, FNP   650 mg at 01/04/18 2231  . alum & mag hydroxide-simeth (MAALOX/MYLANTA) 200-200-20 MG/5ML suspension 30 mL  30 mL Oral Q4H PRN Money, Darnelle Maffucci B, FNP      . amoxicillin-clavulanate (AUGMENTIN) 500-125 MG per tablet 500 mg  1 tablet Oral TID Sharma Covert, MD      . diclofenac sodium (VOLTAREN) 1 % transdermal gel 2 g  2 g Topical QID Sharma Covert, MD      . escitalopram (LEXAPRO) tablet 5 mg  5 mg Oral QHS Starkes, Takia S, FNP      . gabapentin (NEURONTIN) capsule 300 mg  300 mg Oral BID Cobos, Myer Peer, MD      . hydrOXYzine (ATARAX/VISTARIL) tablet 25 mg  25 mg Oral Q6H PRN Money, Lowry Ram, FNP   25 mg at 01/04/18 2114  . hydrOXYzine (ATARAX/VISTARIL) tablet 25 mg  25 mg Oral TID PRN Money, Lowry Ram, FNP      . ibuprofen (ADVIL,MOTRIN) tablet 800 mg  800 mg Oral TID PRN Patriciaann Clan E, PA-C   800 mg at 01/05/18 5852  . loperamide (IMODIUM) capsule 2-4 mg  2-4 mg Oral PRN Money, Lowry Ram, FNP      . LORazepam (ATIVAN) tablet 1 mg  1 mg Oral Q6H PRN Money, Lowry Ram, FNP   1 mg at 01/04/18 2018  . LORazepam (ATIVAN) tablet 1 mg  1 mg Oral QID Money, Lowry Ram, FNP   1 mg at 01/05/18 7782   Followed by  . LORazepam (ATIVAN) tablet 1 mg  1 mg Oral TID Money, Lowry Ram, FNP       Followed by  . [START ON 01/06/2018] LORazepam (ATIVAN) tablet 1 mg  1 mg Oral BID Money, Lowry Ram, FNP        Followed by  . [START ON 01/08/2018] LORazepam (ATIVAN) tablet 1 mg  1 mg Oral Daily Money, Travis B, FNP      . magnesium hydroxide (MILK OF MAGNESIA) suspension 30 mL  30 mL Oral Daily PRN Money, Lowry Ram, FNP      . multivitamin with minerals tablet 1 tablet  1 tablet Oral Daily Money, Lowry Ram, FNP   1 tablet at 01/05/18 [phone]  . nystatin cream (MYCOSTATIN)   Topical BID Sharma Covert, MD      . ondansetron (ZOFRAN-ODT) disintegrating tablet 4 mg  4 mg Oral Q6H PRN Money, Lowry Ram, FNP      . OXcarbazepine (TRILEPTAL) tablet 150 mg  150 mg Oral BID Starkes, Takia S, FNP      . thiamine (VITAMIN B-1) tablet 100 mg  100 mg Oral Daily Money, Lowry Ram, FNP   100 mg at 01/05/18 3614  . traZODone (DESYREL) tablet 50 mg  50 mg Oral QHS PRN,MR X 1 Laverle Hobby, PA-C   50 mg at 01/04/18 2229   PTA Medications: No medications prior to admission.    Patient Stressors: Educational concerns  Financial difficulties Health problems Legal issue  Patient Strengths: Ability for insight Active sense of humor Average or above average intelligence  Treatment Modalities: Medication Management, Group therapy, Case management,  1 to 1 session with clinician, Psychoeducation, Recreational therapy.   Physician Treatment Plan for Primary Diagnosis: MDD (major depressive disorder), severe ([place]) Long Term Goal(s): Improvement in symptoms so as ready for discharge Improvement in symptoms so as ready for discharge   Short Term Goals: Ability to identify changes in lifestyle to reduce recurrence of condition will improve Ability to verbalize feelings will improve Ability to disclose and discuss suicidal ideas Ability to demonstrate self-control will improve Ability to identify and develop effective coping behaviors will improve Ability to maintain clinical measurements within normal limits will improve Compliance with prescribed medications will improve Ability to identify triggers associated with  substance abuse/mental health issues will improve Ability to identify changes in lifestyle to reduce recurrence of condition will improve Ability to verbalize feelings will improve Ability to disclose and discuss suicidal ideas Ability to demonstrate self-control will improve Ability to identify and develop effective coping behaviors will improve Ability to maintain clinical measurements within normal limits will improve Compliance with prescribed medications will improve Ability to identify triggers associated with substance abuse/mental health issues will improve  Medication Management: Evaluate patient's response, side effects, and tolerance of medication regimen.  Therapeutic Interventions: 1 to 1 sessions, Unit Group sessions and Medication administration.  Evaluation of Outcomes: Not Met  Physician Treatment Plan for Secondary Diagnosis: Principal Problem:   MDD (major depressive disorder), severe ([facility]) Active Problems:   Chronic alcohol abuse   Suicidal overdose ([place])   Cocaine abuse, episodic ([place])  Long Term Goal(s): Improvement in symptoms so as ready for discharge Improvement in symptoms so as ready for discharge   Short Term Goals: Ability to identify changes in lifestyle to reduce recurrence of condition will improve Ability to verbalize feelings will improve Ability to disclose and discuss suicidal ideas Ability to demonstrate self-control will improve Ability to identify and develop effective coping behaviors will improve Ability to maintain clinical measurements within normal limits will improve Compliance with prescribed medications will improve Ability to identify triggers associated with substance abuse/mental health issues will improve Ability to identify changes in lifestyle to reduce recurrence of condition will improve Ability to verbalize feelings will improve Ability to disclose and discuss suicidal ideas Ability to demonstrate self-control will  improve Ability to identify and develop effective coping behaviors will improve Ability to maintain clinical measurements within normal limits will improve Compliance with prescribed medications will improve Ability to identify triggers associated with substance abuse/mental health issues will improve     Medication Management: Evaluate patient's response, side effects, and tolerance of medication regimen.  Therapeutic Interventions: 1 to 1 sessions, Unit Group sessions and Medication administration.  Evaluation of Outcomes: Not Met   RN Treatment Plan for Primary Diagnosis: MDD (major depressive disorder), severe ([place]) Long Term Goal(s): Knowledge of disease and therapeutic regimen to maintain health will improve  Short Term Goals: Ability to disclose and discuss suicidal ideas, Ability to identify and develop effective coping behaviors will improve and Compliance with prescribed medications will improve  Medication Management: RN will administer medications as ordered by provider, will assess and evaluate patient's response and provide education to patient for prescribed medication. RN will report any adverse and/or side effects to prescribing provider.  Therapeutic Interventions: 1 on 1 counseling sessions, Psychoeducation, Medication administration, Evaluate responses to treatment, Monitor vital signs and CBGs as  ordered, Perform/monitor CIWA, COWS, AIMS and Fall Risk screenings as ordered, Perform wound care treatments as ordered.  Evaluation of Outcomes: Not Met   LCSW Treatment Plan for Primary Diagnosis: MDD (major depressive disorder), severe ([place]) Long Term Goal(s): Safe transition to appropriate next level of care at discharge, Engage patient in therapeutic group addressing interpersonal concerns.  Short Term Goals: Engage patient in aftercare planning with referrals and resources  Therapeutic Interventions: Assess for all discharge needs, 1 to 1 time with Social worker,  Explore available resources and support systems, Assess for adequacy in community support network, Educate family and significant other(s) on suicide prevention, Complete Psychosocial Assessment, Interpersonal group therapy.  Evaluation of Outcomes: Not Met   Progress in Treatment: Attending groups: No. Participating in groups: No. Taking medication as prescribed: Yes. Toleration medication: Yes. Family/Significant other contact made: No, will contact:  the patient's wife Patient understands diagnosis: Yes. Discussing patient identified problems/goals with staff: Yes. Medical problems stabilized or resolved: Yes. Denies suicidal/homicidal ideation: Yes. Issues/concerns per patient self-inventory: No. Other:  New problem(s) identified:None   New Short Term/Long Term Goal(s):Detox, medication stabilization, elimination of SI thoughts, development of comprehensive mental wellness plan.   Patient Goals: "I want to get better and possible go to a 28 day program"  Discharge Plan or Barriers: CSW will assess for appropriate referrals.   Reason for Continuation of Hospitalization: Depression Medication stabilization  Estimated Length of Stay: 3-5 days   Attendees: Patient: Christine Vasquez  01/05/2018 11:10 AM  Physician: Dr. Myles Lipps, MD 01/05/2018 11:10 AM  Nursing: Vladimir Faster.Viona Gilmore, RN 01/05/2018 11:10 AM  RN Care Manager:X 01/05/2018 11:10 AM  Social Worker: Radonna Ricker, [place] 01/05/2018 11:10 AM  Recreational Therapist: X 01/05/2018 11:10 AM  Other: X 01/05/2018 11:10 AM  Other: x 01/05/2018 11:10 AM  Other:X 01/05/2018 11:10 AM    Scribe for Treatment Team: Marylee Floras, [place] 01/05/2018 11:10 AM

## 2018-01-05 NOTE — Progress Notes (Signed)
Recreation Therapy Notes  Date: 7.1.19 Time: 0930 Location: [address] Dayroom  Group Topic: Stress Management  Goal Area(s) Addresses:  Patient will verbalize importance of using healthy stress management.  Patient will identify positive emotions associated with healthy stress management.   Intervention: Stress Management  Activity :  Progressive Muscle Relaxation.  LRT introduced the stress management technique of progressive muscle relaxation.  LRT read script to guide patients through tensing and relaxing each muscle individually.  Patients were to follow along as LRT lead them through the exercise.  Education:  Stress Management, Discharge Planning.   Education Outcome: Acknowledges edcuation/In group clarification offered/Needs additional education  Clinical Observations/Feedback: Pt did not attend group.    Caroll RancherMarjette Annette Bertelson, LRT/CTRS    Caroll RancherLindsay, Bulmaro Feagans A 01/05/2018 11:43 AM
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
## 2018-01-05 NOTE — B[facility] Group Notes (Signed)
Pt attended spiritual care group on grief and loss facilitated by chaplain Christine Vasquez   Group opened with brief discussion and psycho-social ed around grief and loss in relationships and in relation to self - identifying life patterns, circumstances, changes that cause losses. Established group norm of speaking from own life experience. Group goal of establishing open and affirming space for members to share loss and experience with grief, normalize grief experience and provide psycho social education and grief support.  Christine Vasquez was present throughout group.  Alert and oriented, she engaged in conversation voluntarily.  She noted she was feeling ill due to withdrawal.  In conversation with group, named that grief can be connected to many things.  She noted having coped with loss in her life through the use of alcohol and stated that changing her pattern of use is like "breaking up with a best friend."  Described feeling the need to change alcohol use, as she recognizes this is profoundly affecting her relationship with spouse as well as her own health.   Described feeling frustrated that she can have periods of goodness in her life and when one thing goes wrong, it seems everything crumbles around her.    Christine Vasquez [facility] - stating she needed to do so if she hoped to have a chance to improve her relationship.
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
## 2018-01-06 MED ORDER — CLONIDINE HCL 0.1 MG PO TABS
0.1000 mg | ORAL_TABLET | Freq: Two times a day (BID) | ORAL | Status: DC
  Administered 2018-01-06 – 2018-01-07 (×3): 0.1 mg via ORAL
  Filled 2018-01-06 (×2): qty 14
  Filled 2018-01-06 (×5): qty 1

## 2018-01-06 MED ORDER — NICOTINE 21 MG/24HR TD PT24
21.0000 mg | MEDICATED_PATCH | Freq: Every day | TRANSDERMAL | Status: DC
Start: 2018-01-06 — End: 2018-01-07
  Administered 2018-01-06 – 2018-01-07 (×2): 21 mg via TRANSDERMAL
  Filled 2018-01-06 (×6): qty 1

## 2018-01-06 NOTE — Progress Notes (Signed)
D: Patient presents irritable and in withdrawal. When greeted "good morning," she replied "no it isn't." She reports feeling depressed, tremulous and anxious. She has a mild tremor in bilateral arms. She slept fair last night, and received medication Prn for sleep that was helpful. Her energy, appetite, and concentration are good. She rates her depression 9/10, and sense of hopelessness and anxiety 10/10. She reports tremors, chills, cravings, cramps, nausea, agitation, irritability, and anger. She is having occasional suicidal thoughts without a plan. She has a rash to right buttocks, and is receiving nystatin. She continues to c/o toothache 6/10.  A: Patient checked q15 min, and checks reviewed. Reviewed medication changes with patient and educated on side effects. Educated patient on importance of attending group therapy sessions and educated on several coping skills. Encouarged participation in milieu through recreation therapy and attending meals with peers.  R: Patient receptive to education on medications, and is medication compliant. Patient contracts for safety on the unit.

## 2018-01-06 NOTE — Progress Notes (Signed)
CSW left message for pt's probation officer Rogelia BogaJamie Campbell at [phone]. CSW spoke with [facility]hayla in admissions at [facility]--pt information under review currently.  Kaylob Wallen S. Alan RipperHolloway, MSW, LCSW Clinical Social Worker 01/06/2018 2:00 PM

## 2018-01-06 NOTE — Progress Notes (Signed)
Recreation Therapy Notes  Animal-Assisted Activity (AAA) Program Checklist/Progress Notes Patient Eligibility Criteria Checklist & Daily Group note for Rec Tx Intervention  Date: 7.2.19 Time: 1430 [address]  AAA/T Program Assumption of Risk Form signed by Engineer, productionatient/ or Parent Legal Guardian YES  Patient is free of allergies or sever asthma NO   Patient reports no fear of animals YES   Patient reports no history of cruelty to animals YES   Patient understands his/her participation is voluntary YES   Patient washes hands before animal contact YES   Patient washes hands after animal contact YES   Education: Charity fundraiserHand Washing, Appropriate Animal Interaction   Education Outcome: Acknowledges understanding/In group clarification offered/Needs additional education.   Clinical Observations/Feedback: Pt did not attend group.    Caroll RancherMarjette Advit Trethewey, LRT/CTRS         Caroll RancherLindsay, Dona Klemann A 01/06/2018 3:42 PM

## 2018-01-06 NOTE — Plan of Care (Signed)
Patient is depressed, irritable and experiencing withdrawal symptoms. She states "it usually takes 5 or 6 days." Her goal today is "don't drink" and to meet that goal, "stay put, go to meetings, start my workbook."

## 2018-01-06 NOTE — Progress Notes (Signed)
Pt reports she is having a difficult time with her detox and dealing with her life stresses is making it even more difficult.  She was tearful this evening.  She reports having nausea, tremors, general pain, along with the tooth pain from an abscessed tooth.  Writer discussed her med options and was medicated accordingly.  She took a shower this evening which made her feel better.  Support and encouragement offered.  Pt makes her needs known to staff.  Discharge plans are in process.  Pt wants to go to a long term treatment program after detox.  Safety maintained with q15 minute checks.

## 2018-01-06 NOTE — Progress Notes (Signed)
Attended group 

## 2018-01-06 NOTE — Progress Notes (Signed)
[facility] MD Progress Note  01/06/2018 10:38 AM Christine Vasquez  MRN:  [id number] Subjective: Patient is seen and examined.  Patient's 42 year old female with a past psychiatric history significant for alcohol use disorder, cocaine use disorder and suicidal ideation.  She is seen in follow-up.  She stated her withdrawal symptoms are little bit better today, but overall she still feels badly.  She had significant nausea.  She stated she still has some suicidal ideation.  We discussed her alcohol withdrawal, and what to expect.  She stated that she is interested in residential substance abuse treatment after she completes detox.  She stated the Voltaren gel did help her neck pain, and overall there was mild improvement. Principal Problem: MDD (major depressive disorder), severe ([place]) Diagnosis:   Patient Active Problem List   Diagnosis Date Noted  . MDD (major depressive disorder), severe ([place]) [F32.2] 01/04/2018  . Bipolar 1 disorder, mixed, moderate ([place]) [F31.62] 12/24/2016  . Chronic alcohol abuse [F10.10] 12/24/2016  . Suicidal overdose ([place]) [T50.902A] 12/24/2016  . Cocaine abuse, episodic ([place]) [F14.10] 12/24/2016   Total Time spent with patient: 20 minutes  Past Psychiatric History: See admission H&P  Past Medical History:  Past Medical History:  Diagnosis Date  . Anxiety   . Bipolar disorder ([place])   . Depression   . Polysubstance abuse ([place]) 12/23/2016    Past Surgical History:  Procedure Laterality Date  . NO PAST SURGERIES     Family History: History reviewed. No pertinent family history. Family Psychiatric  History: See admission H&P Social History:  Social History   Substance and Sexual Activity  Alcohol Use Yes  . Alcohol/week: 50.4 oz  . Types: 84 Cans of beer per week     Social History   Substance and Sexual Activity  Drug Use Yes  . Types: "Crack" cocaine, Marijuana    Social History   Socioeconomic History  . Marital status: Single    Spouse name:  Not on file  . Number of children: Not on file  . Years of education: Not on file  . Highest education level: Not on file  Occupational History  . Not on file  Social Needs  . Financial resource strain: Not on file  . Food insecurity:    Worry: Not on file    Inability: Not on file  . Transportation needs:    Medical: Not on file    Non-medical: Not on file  Tobacco Use  . Smoking status: Current Every Day Smoker    Packs/day: 1.00    Types: Cigarettes  . Smokeless tobacco: Never Used  Substance and Sexual Activity  . Alcohol use: Yes    Alcohol/week: 50.4 oz    Types: 84 Cans of beer per week  . Drug use: Yes    Types: "Crack" cocaine, Marijuana  . Sexual activity: Yes    Birth control/protection: None  Lifestyle  . Physical activity:    Days per week: Not on file    Minutes per session: Not on file  . Stress: Not on file  Relationships  . Social connections:    Talks on phone: Not on file    Gets together: Not on file    Attends religious service: Not on file    Active member of club or organization: Not on file    Attends meetings of clubs or organizations: Not on file    Relationship status: Not on file  Other Topics Concern  . Not on file  Social History Narrative  .  Not on file   Additional Social History:    Pain Medications: pt denies taking any medications Prescriptions: pt denies taking any medications Over the Counter: pt denies taking any medications History of alcohol / drug use?: Yes Longest period of sobriety (when/how long): 5 days  Negative Consequences of Use: Financial, Personal relationships Withdrawal Symptoms: Change in blood pressure, Agitation, Aggressive/Assaultive, Fever / Chills, Seizures Name of Substance 1: alcohol 1 - Amount (size/oz): 12 pack of beer 1 - Frequency: daily 1 - Duration: ongoing 1 - Last Use / Amount: prior to arrival at ED Name of Substance 2: Crack 2 - Age of First Use: 14 2 - Amount (size/oz): $40.00 2 -  Frequency: 2-3 times per week 2 - Duration: ongoing 2 - Last Use / Amount: + for cocaine on drug screen states last time was a week ago                 Sleep: Fair  Appetite:  Fair  Current Medications: Current Facility-Administered Medications  Medication Dose Route Frequency Provider Last Rate Last Dose  . acetaminophen (TYLENOL) tablet 650 mg  650 mg Oral Q6H PRN Money, Lowry Ram, FNP   650 mg at 01/04/18 2231  . alum & mag hydroxide-simeth (MAALOX/MYLANTA) 200-200-20 MG/5ML suspension 30 mL  30 mL Oral Q4H PRN Money, Darnelle Maffucci B, FNP      . amoxicillin-clavulanate (AUGMENTIN) 500-125 MG per tablet 500 mg  1 tablet Oral TID Sharma Covert, MD   500 mg at 01/06/18 0926  . diclofenac sodium (VOLTAREN) 1 % transdermal gel 2 g  2 g Topical QID Sharma Covert, MD   2 g at 01/05/18 2210  . escitalopram (LEXAPRO) tablet 5 mg  5 mg Oral QHS Nanci Pina, FNP   5 mg at 01/05/18 2208  . gabapentin (NEURONTIN) capsule 300 mg  300 mg Oral BID Cobos, Myer Peer, MD   300 mg at 01/06/18 0924  . hydrOXYzine (ATARAX/VISTARIL) tablet 25 mg  25 mg Oral Q6H PRN Money, Lowry Ram, FNP   25 mg at 01/05/18 2209  . hydrOXYzine (ATARAX/VISTARIL) tablet 25 mg  25 mg Oral TID PRN Money, Lowry Ram, FNP      . ibuprofen (ADVIL,MOTRIN) tablet 800 mg  800 mg Oral TID PRN Patriciaann Clan E, PA-C   800 mg at 01/05/18 2209  . loperamide (IMODIUM) capsule 2-4 mg  2-4 mg Oral PRN Money, Lowry Ram, FNP      . LORazepam (ATIVAN) tablet 1 mg  1 mg Oral Q6H PRN Money, Lowry Ram, FNP   1 mg at 01/04/18 2018  . LORazepam (ATIVAN) tablet 1 mg  1 mg Oral TID Money, Lowry Ram, FNP   1 mg at 01/06/18 0973   Followed by  . LORazepam (ATIVAN) tablet 1 mg  1 mg Oral BID Money, Lowry Ram, FNP       Followed by  . [START ON 01/08/2018] LORazepam (ATIVAN) tablet 1 mg  1 mg Oral Daily Money, Travis B, FNP      . magnesium hydroxide (MILK OF MAGNESIA) suspension 30 mL  30 mL Oral Daily PRN Money, Darnelle Maffucci B, FNP      . multivitamin  with minerals tablet 1 tablet  1 tablet Oral Daily Money, Lowry Ram, FNP   1 tablet at 01/06/18 5329  . nystatin cream (MYCOSTATIN)   Topical BID Sharma Covert, MD      . ondansetron (ZOFRAN-ODT) disintegrating tablet 4 mg  4 mg Oral Q6H  PRN Money, Lowry Ram, FNP   4 mg at 01/05/18 2209  . OXcarbazepine (TRILEPTAL) tablet 150 mg  150 mg Oral BID Nanci Pina, FNP   150 mg at 01/06/18 2671  . thiamine (VITAMIN B-1) tablet 100 mg  100 mg Oral Daily Money, Lowry Ram, FNP   100 mg at 01/06/18 2458  . traZODone (DESYREL) tablet 50 mg  50 mg Oral QHS PRN,MR X 1 Laverle Hobby, PA-C   50 mg at 01/05/18 2257    Lab Results:  Results for orders placed or performed during the hospital encounter of 01/04/18 (from the past 48 hour(s))  Comprehensive metabolic panel     Status: Abnormal   Collection Time: 01/05/18  6:27 AM  Result Value Ref Range   Sodium 139 135 - 145 mmol/L   Potassium 3.5 3.5 - 5.1 mmol/L   Chloride 103 98 - 111 mmol/L    Comment: Please note change in reference range.   CO2 27 22 - 32 mmol/L   Glucose, Bld 110 (H) 70 - 99 mg/dL    Comment: Please note change in reference range.   BUN 14 6 - 20 mg/dL    Comment: Please note change in reference range.   Creatinine, Ser 0.61 0.44 - 1.00 mg/dL   Calcium 9.3 8.9 - 10.3 mg/dL   Total Protein 6.1 (L) 6.5 - 8.1 g/dL   Albumin 3.4 (L) 3.5 - 5.0 g/dL   AST 99 (H) 15 - 41 U/L   ALT 160 (H) 0 - 44 U/L    Comment: Please note change in reference range.   Alkaline Phosphatase 86 38 - 126 U/L   Total Bilirubin 0.5 0.3 - 1.2 mg/dL   GFR calc non Af Amer >60 >60 mL/min   GFR calc Af Amer >60 >60 mL/min    Comment: (NOTE) The eGFR has been calculated using the CKD EPI equation. This calculation has not been validated in all clinical situations. eGFR's persistently <60 mL/min signify possible Chronic Kidney Disease.    Anion gap 9 5 - 15    Comment: Performed at [facility], [place] [address], [place],  [place] [address]  TSH     Status: Abnormal   Collection Time: 01/05/18  6:27 AM  Result Value Ref Range   TSH 5.203 (H) 0.350 - 4.500 uIU/mL    Comment: Performed by a 3rd Generation assay with a functional sensitivity of <=0.01 uIU/mL. Performed at [facility], [place] [address] [place], [place] [address]   Hemoglobin A1c     Status: None   Collection Time: 01/05/18  6:27 AM  Result Value Ref Range   Hgb A1c MFr Bld 5.2 4.8 - 5.6 %    Comment: (NOTE) Pre diabetes:          5.7%-6.4% Diabetes:              >6.4% Glycemic control for   <7.0% adults with diabetes    Mean Plasma Glucose 102.54 mg/dL    Comment: Performed at [facility] [address] [place], [place] [address]  Lipid panel     Status: Abnormal   Collection Time: 01/05/18  6:27 AM  Result Value Ref Range   Cholesterol 173 0 - 200 mg/dL   Triglycerides 166 (H) <150 mg/dL   HDL 53 >40 mg/dL   Total CHOL/HDL Ratio 3.3 RATIO   VLDL 33 0 - 40 mg/dL   LDL Cholesterol 87 0 - 99 mg/dL  Comment:        Total Cholesterol/HDL:CHD Risk Coronary Heart Disease Risk Table                     Men   Women  1/2 Average Risk   3.4   3.3  Average Risk       5.0   4.4  2 X Average Risk   9.6   7.1  3 X Average Risk  23.4   11.0        Use the calculated Patient Ratio above and the CHD Risk Table to determine the patient's CHD Risk.        ATP III CLASSIFICATION (LDL):  <100     mg/dL   Optimal  100-129  mg/dL   Near or Above                    Optimal  130-159  mg/dL   Borderline  160-189  mg/dL   High  >190     mg/dL   Very High Performed at [facility] [address], [place], [place] [address]     Blood Alcohol level:  Lab Results  Component Value Date   [facility]  12/06/2009    <5        LOWEST DETECTABLE LIMIT FOR SERUM ALCOHOL IS 5 mg/dL FOR MEDICAL PURPOSES ONLY   ETH (H) 12/05/2009    208        LOWEST DETECTABLE LIMIT FOR SERUM ALCOHOL IS 5 mg/dL FOR MEDICAL  PURPOSES ONLY    Metabolic Disorder Labs: Lab Results  Component Value Date   HGBA1C 5.2 01/05/2018   MPG 102.54 01/05/2018   No results found for: PROLACTIN Lab Results  Component Value Date   CHOL 173 01/05/2018   TRIG 166 (H) 01/05/2018   HDL 53 01/05/2018   CHOLHDL 3.3 01/05/2018   VLDL 33 01/05/2018   LDLCALC 87 01/05/2018    Physical Findings: AIMS: Facial and Oral Movements Muscles of Facial Expression: None, normal Lips and Perioral Area: None, normal Jaw: None, normal Tongue: None, normal,Extremity Movements Upper (arms, wrists, hands, fingers): None, normal Lower (legs, knees, ankles, toes): None, normal, Trunk Movements Neck, shoulders, hips: None, normal, Overall Severity Severity of abnormal movements (highest score from questions above): None, normal Incapacitation due to abnormal movements: None, normal Patient's awareness of abnormal movements (rate only patient's report): No Awareness, Dental Status Current problems with teeth and/or dentures?: No Does patient usually wear dentures?: No  CIWA:  CIWA-Ar Total: 6 COWS:  COWS Total Score: 1  Musculoskeletal: Strength & Muscle Tone: within normal limits Gait & Station: normal Patient leans: N/A  Psychiatric Specialty Exam: Physical Exam  Nursing note and vitals reviewed. Constitutional: She is oriented to person, place, and time. She appears well-developed and well-nourished.  HENT:  Head: Normocephalic and atraumatic.  Respiratory: Effort normal.  Neurological: She is alert and oriented to person, place, and time.    ROS  Blood pressure (!) 155/99, pulse 72, temperature 98.4 F (36.9 C), temperature source Oral, resp. rate 18, height 5' 6.5" (1.689 m), weight 78.5 kg (173 lb), SpO2 100 %.Body mass index is 27.5 kg/m.  General Appearance: Disheveled  Eye Contact:  Fair  Speech:  Slow  Volume:  Decreased  Mood:  Dysphoric  Affect:  Congruent  Thought Process:  Coherent  Orientation:  Full  (Time, Place, and Person)  Thought Content:  Logical  Suicidal Thoughts:  Yes.  without intent/plan  Homicidal Thoughts:  No  Memory:  Immediate;   Fair Recent;   Fair Remote;   Fair  Judgement:  Intact  Insight:  Fair  Psychomotor Activity:  Increased  Concentration:  Concentration: Fair and Attention Span: Fair  Recall:  [employer] of Knowledge:  Fair  Language:  Fair  Akathisia:  Negative  Handed:  Right  AIMS (if indicated):     Assets:  Desire for Improvement Physical Health Resilience  ADL's:  Intact  Cognition:  WNL  Sleep:  Number of Hours: 5.5     Treatment Plan Summary: Daily contact with patient to assess and evaluate symptoms and progress in treatment, Medication management and Plan Patient is seen and examined.  Patient's 42 year old female with the above-stated past psychiatric history seen in follow-up.  She is doing a little bit better today, but still having withdrawal symptoms.  She complained of some nausea this morning.  She does have Zofran available for her.  She remains on the lorazepam detox protocol.  She also remains on Lexapro 5 mg p.o. daily, Neurontin 300 mg p.o. twice daily, Trileptal 150 mg p.o. twice daily as well as trazodone.  No change in these current medications.  She does desire to talk to social work about residential substance abuse treatment.  Her blood pressure is elevated today, and we will be monitoring that.  It is whether it is secondary to underlying essential hypertension or due to her withdrawal syndromes.  We will monitor that.  Sharma Covert, MD 01/06/2018, 10:38 AM

## 2018-01-06 NOTE — [facility] Group Notes (Signed)
[facility] 01/06/2018 1:15pm  Type of Therapy: Mental Health Association Presentation  Participation Level: Pt invited. Chose to remain in bed.    Rona RavensHeather S Saanya Zieske, LCSW 01/06/2018 3:11 PM

## 2018-01-07 MED ORDER — OXCARBAZEPINE 150 MG PO TABS: 150.0000 mg | ORAL_TABLET | Freq: Two times a day (BID) | ORAL | 0 refills | Status: AC

## 2018-01-07 MED ORDER — HYDROXYZINE HCL 25 MG PO TABS: 25.0000 mg | ORAL_TABLET | Freq: Three times a day (TID) | ORAL | 0 refills | Status: AC | PRN

## 2018-01-07 MED ORDER — TRAZODONE HCL 50 MG PO TABS: 50.0000 mg | ORAL_TABLET | Freq: Every evening | ORAL | 0 refills | Status: AC | PRN

## 2018-01-07 MED ORDER — ESCITALOPRAM OXALATE 5 MG PO TABS: 5.0000 mg | ORAL_TABLET | Freq: Every day | ORAL | 0 refills | Status: AC

## 2018-01-07 MED ORDER — GABAPENTIN 300 MG PO CAPS: 300.0000 mg | ORAL_CAPSULE | Freq: Two times a day (BID) | ORAL | 0 refills | Status: AC

## 2018-01-07 MED ORDER — AMOXICILLIN-POT CLAVULANATE 500-125 MG PO TABS: 1.0000 | ORAL_TABLET | Freq: Three times a day (TID) | ORAL | 0 refills | Status: AC

## 2018-01-07 MED ORDER — TRAZODONE HCL 50 MG PO TABS
50.0000 mg | ORAL_TABLET | Freq: Every evening | ORAL | Status: DC | PRN
  Filled 2018-01-07: qty 7

## 2018-01-07 NOTE — Progress Notes (Signed)
CSW spoke with Shayla at [facility] this morning. Pt is declined due to multiple upcoming court dates and pending assault charge with active warrant. CSW and Marvia Pickles NP met with pt to discuss disposition. Pt states that her PO dropped her and is violating her, which means she will be sentenced to mandatory 45 days in jail. Pt is requesting to discharge to "get my ducks in a row" before she turns herself in. Follow-up at [facility].CSW and pt contacted her mother to inform her of above and made multiple attempts to contact her wife. Pt is discharging today.  Hakim Minniefield S. Ouida Sills, MSW, LCSW Clinical Social Worker 01/07/2018 9:18 AM

## 2018-01-07 NOTE — Therapy (Signed)
Occupational Therapy Group Treatment Note  Date:  01/07/2018 Time:  11:18 AM  Group Topic/Focus:  Stress Management  Participation Level:  Active  Participation Quality:  Appropriate and Sharing  Affect:  Depressed and Flat  Cognitive:  Appropriate  Insight: Improving and Lacking  Engagement in Group:  Engaged  Modes of Intervention:  Activity, Discussion and Education  Additional Comments:    S: "I usually keep it going until I am in handcuffs"  O: Stress management group completed to use as productive coping strategy, to help mitigate maladaptive coping to integrate in functional BADL/IADL when reintegrating into community. Education given on the definition of stress and its cognitive, behavioral, emotional, and physical effects on the body. Stress management tools worksheet completed to identify negative coping mechanisms and their short and long term effects vs positive coping mechanisms with demonstration. Coping strategies taught include: relaxation based- deep breathing, counting to 10, taking a 1 minute vacation, acceptance, stress balls, relaxation audio/video, visual/mental imagery. Positive mental attitude- gratitude, acceptance, cognitive reframing, positive self talk, anger management. Progressive muscle relaxation script administered to facilitate relaxation response for more productive engagement in BADL/IADL. Adult coloring and relaxation tips worksheet given at end of session.   A: Pt presents to group with flat and depressed affect, also mildly irritable. Pt participatory and sharing of personal examples in group. Pt engaged in stress management tools worksheet, offering examples and how to improve current practices. Pt states she often suppresses her emotions or acts out. She states "I just need to know when to walk away, because I don't and I end up in handcuffs". Pt would like to work on letting go of resentments, conflict resolution, and anger management. When asked pt  how will she work on this she states "I just gotta learn to walk away" again.  Pt with current warrant out for arrest this date. Pt engaged in PMR script, with much enjoyment and significant relaxation response. Pt eager to acquire handouts at end of session.  P: Pt provided with education on stress management activities to implement into daily routine. Handouts given to facilitate carryover when reintegrating into community     [facility]aylee Arisbeth Purrington, [place] YorkMSOT, OTR/L  AvnetKaylee Trevor Duty 01/07/2018, 11:18 AM

## 2018-01-07 NOTE — Progress Notes (Cosign Needed)
Pt discharged home in a bus pass. Pt was ambulatory, stable and appreciative at that time. All papers, medication sample prescriptions were given and valuables returned. Verbal understanding expressed. Denies SI/HI and A/VH. Pt given opportunity to express concerns and ask questions.

## 2018-01-07 NOTE — Progress Notes (Signed)
  [facility] :  Will you be returning to the same living situation after discharge:  Yes,  Home with wife At discharge, do you have transportation home?: Yes,  wife  Do you have the ability to pay for your medications: Yes,  mental health  Release of information consent forms completed and submitted to medical records by CSW.  Patient to Follow up at: Follow-up Information    [facility] Follow up on 01/09/2018.   Why:  Hospital follow-up on Friday, 01/09/18 at 1:45PM. Please bring ID, and any proof of income if you have it. Thank you.  Contact information: [address] [place] [address] [phone]        [facility] Follow up.   Specialty:  Addiction Medicine Why:  You have been referred for residential treatment. Per Shayla in admissions, you must handle court/pending charges before being eligible for treatment. Please call her at [phone] once you have done this if still interested in pursuing treatment.  Contact information: [address] [place] [address] [phone]           Next level of care provider has [facility] to [facility] Link:no  Safety Planning and Suicide Prevention discussed: Yes,  SPE completed with pt; contact attempts made with pt's wife. SPI pamphlet and Mobile Crisis information provided.  Have you used any form of tobacco in the last 30 days? (Cigarettes, Smokeless Tobacco, Cigars, and/or Pipes): Yes  Has patient been referred to the Quitline?: Patient refused referral  Patient has been referred for addiction treatment: Yes  Rona RavensHeather S Darlis Wragg, LCSW 01/07/2018, 9:59 AM
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
## 2018-01-07 NOTE — [facility] Group Notes (Signed)
[facility] 01/07/2018 1:15pm  Type of Therapy: Mental Health Association Presentation  Participation Level: Active  Participation Quality: Attentive  Affect: Appropriate  Cognitive: Oriented  Insight: Developing/Improving  Engagement in Therapy: Engaged  Modes of Intervention: Discussion, Education and Socialization  Summary of Progress/Problems: Mental Health Association (MHA) Speaker came to talk about his personal journey with mental health. The pt processed ways by which to relate to the speaker. MHA speaker provided handouts and educational information pertaining to groups and services offered by the [facility]. Pt was engaged in speaker's presentation and was receptive to resources provided.    Rona RavensHeather S Anzal Bartnick, LCSW 01/07/2018 10:51 AM,

## 2018-01-07 NOTE — [facility] Suicide Risk Assessment (Signed)
[facility]   Principal Problem: MDD (major depressive disorder), severe [facility](HCC) Discharge Diagnoses:  Patient Active Problem List   Diagnosis Date Noted  . MDD (major depressive disorder), severe (HCC) [F32.2] 01/04/2018  . Bipolar 1 disorder, mixed, moderate (HCC) [F31.62] 12/24/2016  . Chronic alcohol abuse [F10.10] 12/24/2016  . Suicidal overdose (HCC) [T50.902A] 12/24/2016  . Cocaine abuse, episodic (HCC) [F14.10] 12/24/2016    Total Time spent with patient: 30 minutes  Musculoskeletal: Strength & Muscle Tone: within normal limits Gait & Station: normal Patient leans: N/A  Psychiatric Specialty Exam: Review of Systems  All other systems reviewed and are negative.   Blood pressure (!) 126/97, pulse 78, temperature 97.8 F (36.6 C), temperature source Oral, resp. rate 16, height 5' 6.5" (1.689 m), weight 78.5 kg (173 lb), SpO2 100 %.Body mass index is 27.5 kg/m.  General Appearance: Casual  Eye Contact::  Fair  Speech:  Normal Rate409  Volume:  Normal  Mood:  Anxious  Affect:  Congruent  Thought Process:  Coherent  Orientation:  Full (Time, Place, and Person)  Thought Content:  Logical  Suicidal Thoughts:  No  Homicidal Thoughts:  No  Memory:  Immediate;   Fair Recent;   Fair Remote;   Fair  Judgement:  Intact  Insight:  Lacking  Psychomotor Activity:  Increased  Concentration:  Fair  Recall:  [employer]Fair  Fund of Knowledge:Fair  Language: Good  Akathisia:  Negative  Handed:  Right  AIMS (if indicated):     Assets:  Desire for Improvement Physical Health Resilience  Sleep:  Number of Hours: 6.5  Cognition: WNL  ADL's:  Intact   Mental Status Per Nursing Assessment::   On Admission:  Suicidal ideation indicated by patient, Self-harm thoughts  Demographic Factors:  Caucasian, Gay, lesbian, or bisexual orientation, Living alone and Unemployed  Loss Factors: Legal issues  Historical Factors: Impulsivity  Risk Reduction Factors:    NA  Continued Clinical Symptoms:  Alcohol/Substance Abuse/Dependencies  Cognitive Features That Contribute To Risk:  None    Suicide Risk:  Minimal: No identifiable suicidal ideation.  Patients presenting with no risk factors but with morbid ruminations; may be classified as minimal risk based on the severity of the depressive symptoms  Follow-up Information    Inc, [facility] Follow up.   Contact information: [address] [place] [address] [phone]           Plan Of Care/Follow-up recommendations:  Activity:  ad lib  Antonieta PertGreg Lawson Lucine Bilski, MD 01/07/2018, 9:27 AM
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
## 2020-03-08 DEATH — deceased
